# Patient Record
Sex: Male | Born: 1970 | Race: White | Hispanic: No | Marital: Married | Smoking: Former smoker
Health system: Southern US, Community
[De-identification: ages and names within clinical notes are randomized; demographics above are authoritative.]

## PROBLEM LIST (undated history)

## (undated) ENCOUNTER — Emergency Department (HOSPITAL_COMMUNITY): Admission: EM | Payer: Self-pay | Source: Home / Self Care

## (undated) DIAGNOSIS — I10 Essential (primary) hypertension: Secondary | ICD-10-CM

## (undated) DIAGNOSIS — R42 Dizziness and giddiness: Secondary | ICD-10-CM

## (undated) DIAGNOSIS — M109 Gout, unspecified: Secondary | ICD-10-CM

## (undated) DIAGNOSIS — J45909 Unspecified asthma, uncomplicated: Secondary | ICD-10-CM

## (undated) DIAGNOSIS — T7840XA Allergy, unspecified, initial encounter: Secondary | ICD-10-CM

## (undated) HISTORY — PX: CHOLECYSTECTOMY: SHX55

## (undated) HISTORY — DX: Unspecified asthma, uncomplicated: J45.909

## (undated) HISTORY — PX: VASECTOMY REVERSAL: SHX243

## (undated) HISTORY — DX: Gout, unspecified: M10.9

## (undated) HISTORY — DX: Dizziness and giddiness: R42

## (undated) HISTORY — DX: Allergy, unspecified, initial encounter: T78.40XA

## (undated) HISTORY — PX: VASECTOMY: SHX75

## (undated) HISTORY — DX: Essential (primary) hypertension: I10

## (undated) HISTORY — PX: ESOPHAGEAL DILATION: SHX303

---

## 2011-01-08 ENCOUNTER — Emergency Department (HOSPITAL_COMMUNITY)
Admission: EM | Admit: 2011-01-08 | Discharge: 2011-01-08 | Discharge status: Against Medical Advice | Payer: BLUE CROSS/BLUE SHIELD | Attending: Emergency Medicine | Admitting: Emergency Medicine

## 2011-01-08 DIAGNOSIS — Z79899 Other long term (current) drug therapy: Secondary | ICD-10-CM | POA: Insufficient documentation

## 2011-01-08 DIAGNOSIS — R12 Heartburn: Secondary | ICD-10-CM | POA: Insufficient documentation

## 2011-01-08 DIAGNOSIS — Z862 Personal history of diseases of the blood and blood-forming organs and certain disorders involving the immune mechanism: Secondary | ICD-10-CM | POA: Insufficient documentation

## 2011-01-08 DIAGNOSIS — R1011 Right upper quadrant pain: Secondary | ICD-10-CM | POA: Insufficient documentation

## 2011-01-08 DIAGNOSIS — Z8639 Personal history of other endocrine, nutritional and metabolic disease: Secondary | ICD-10-CM | POA: Insufficient documentation

## 2011-01-08 DIAGNOSIS — M549 Dorsalgia, unspecified: Secondary | ICD-10-CM | POA: Insufficient documentation

## 2011-01-11 ENCOUNTER — Encounter: Payer: Self-pay | Admitting: Family Medicine

## 2011-01-11 ENCOUNTER — Ambulatory Visit (INDEPENDENT_AMBULATORY_CARE_PROVIDER_SITE_OTHER): Payer: BLUE CROSS/BLUE SHIELD | Admitting: Family Medicine

## 2011-01-11 VITALS — BP 130/100 | Temp 98.2°F | Ht 64.5 in | Wt 188.0 lb

## 2011-01-11 DIAGNOSIS — R1011 Right upper quadrant pain: Secondary | ICD-10-CM

## 2011-01-11 LAB — BASIC METABOLIC PANEL
CO2: 29 mEq/L (ref 19–32)
Chloride: 103 mEq/L (ref 96–112)
Potassium: 4.5 mEq/L (ref 3.5–5.1)
Sodium: 140 mEq/L (ref 135–145)

## 2011-01-11 LAB — CBC WITH DIFFERENTIAL/PLATELET
Basophils Relative: 0.5 % (ref 0.0–3.0)
Eosinophils Absolute: 0.5 10*3/uL (ref 0.0–0.7)
HCT: 49.2 % (ref 39.0–52.0)
Hemoglobin: 17.1 g/dL — ABNORMAL HIGH (ref 13.0–17.0)
MCHC: 34.8 g/dL (ref 30.0–36.0)
MCV: 95 fl (ref 78.0–100.0)
Monocytes Absolute: 0.5 10*3/uL (ref 0.1–1.0)
Neutro Abs: 3.1 10*3/uL (ref 1.4–7.7)
RBC: 5.18 Mil/uL (ref 4.22–5.81)

## 2011-01-11 LAB — AMYLASE: Amylase: 56 U/L (ref 27–131)

## 2011-01-11 LAB — HEMOGLOBIN A1C: Hgb A1c MFr Bld: 5.2 % (ref 4.6–6.5)

## 2011-01-11 LAB — HEPATIC FUNCTION PANEL
ALT: 125 U/L — ABNORMAL HIGH (ref 0–53)
Albumin: 4.4 g/dL (ref 3.5–5.2)
Total Protein: 7.4 g/dL (ref 6.0–8.3)

## 2011-01-11 NOTE — Progress Notes (Signed)
  Subjective:    Patient ID: Jacob Fletcher, male    DOB: 02-12-1971, 40 y.o.   MRN: [id number]  HPIRichard is a 40 year old, divorced male, who comes in today for a violation of abdominal pain.  He states that 4 days ago.  He ate some fast food and about two to 3 hours later, he developed severe right upper quadrant abdominal pain.  It radiated up into his scapula.  After an hour.  The pain was a 7 to 8 and would not abate therefore, he called a friend, who took him to the emergency room.  Once he got to the emergency room.  The pain began to abate and by the time he was seen.  His pain was almost gone.  Date recommended an EKG and other studies.  However, he felt since his pain was gone.  He was in a go home, which he did.  He had a history of esophageal reflux with esophageal stricture that was dilated in 2009.  He supposed to be on Prilosec 20 daily forever, but is not taking his Prilosec.  He states he is altered his diet and has no symptoms of reflux anymore.  Family history is pertinent in his father had his gallbladder removed.    Review of Systems    General and GI review of systems other than above negative Objective:   Physical Exam Will help, well-nourished, male in no acute distress.  Examination the abdomen shows the abdomen is soft.  The bowel sounds are normal.  There is some tenderness to palpation.  Right upper quadrant.  I can appreciate no masses       Assessment & Plan:  Right upper quadrant, p.c., abdominal pain, probable gallbladder disease,,,,,,,,,, complete fat free diet,,,,,,,, labs today,,,,,,,,,,, set up ultrasound,,,,,,,,,,,,,,,,,, follow-up after ultrasound

## 2011-01-11 NOTE — Patient Instructions (Signed)
Complete fat free diet.  Labs today.  We will set you up for an ultrasound ASAP,,,,,,,,,, set up a follow-up appointment after the ultrasound to review the data with me.  If in the meantime, he does develop severe pain despite being on a fat-free diet come directly and immediately to the emergency room

## 2011-01-12 ENCOUNTER — Other Ambulatory Visit: Payer: Self-pay | Admitting: Family Medicine

## 2011-01-12 DIAGNOSIS — R1011 Right upper quadrant pain: Secondary | ICD-10-CM

## 2011-01-13 ENCOUNTER — Ambulatory Visit: Payer: BLUE CROSS/BLUE SHIELD | Admitting: Family Medicine

## 2011-01-13 ENCOUNTER — Ambulatory Visit
Admission: RE | Admit: 2011-01-13 | Discharge: 2011-01-13 | Disposition: A | Discharge status: Home / Self Care | Payer: BLUE CROSS/BLUE SHIELD | Source: Ambulatory Visit | Attending: Family Medicine | Admitting: Family Medicine

## 2011-01-13 DIAGNOSIS — R1011 Right upper quadrant pain: Secondary | ICD-10-CM

## 2011-01-14 ENCOUNTER — Telehealth: Payer: Self-pay | Admitting: *Deleted

## 2011-01-14 NOTE — Telephone Encounter (Signed)
patient  Has an appointment with the surgeon 01/31/11 and would like to know if it is okay to wait until then?

## 2011-01-17 NOTE — Telephone Encounter (Signed)
I think that's okay about if he feels like he needs to be seen sooner.  Tell him to call the general surgeon's office and to be seen sooner

## 2011-01-18 NOTE — Telephone Encounter (Signed)
Left message on machine for patient

## 2011-02-08 ENCOUNTER — Other Ambulatory Visit: Payer: Self-pay | Admitting: General Surgery

## 2011-02-08 ENCOUNTER — Encounter (HOSPITAL_COMMUNITY): Payer: BC Managed Care – PPO | Attending: General Surgery

## 2011-02-08 DIAGNOSIS — Z01812 Encounter for preprocedural laboratory examination: Secondary | ICD-10-CM | POA: Insufficient documentation

## 2011-02-08 DIAGNOSIS — R1011 Right upper quadrant pain: Secondary | ICD-10-CM | POA: Insufficient documentation

## 2011-02-08 DIAGNOSIS — K8020 Calculus of gallbladder without cholecystitis without obstruction: Secondary | ICD-10-CM | POA: Insufficient documentation

## 2011-02-08 LAB — COMPREHENSIVE METABOLIC PANEL
Alkaline Phosphatase: 81 U/L (ref 39–117)
BUN: 12 mg/dL (ref 6–23)
Chloride: 102 mEq/L (ref 96–112)
Creatinine, Ser: 0.95 mg/dL (ref 0.4–1.5)
Glucose, Bld: 71 mg/dL (ref 70–99)
Potassium: 3.9 mEq/L (ref 3.5–5.1)
Total Bilirubin: 0.5 mg/dL (ref 0.3–1.2)

## 2011-02-08 LAB — CBC
HCT: 47.3 % (ref 39.0–52.0)
MCH: 31.5 pg (ref 26.0–34.0)
MCV: 90.4 fL (ref 78.0–100.0)
RBC: 5.23 MIL/uL (ref 4.22–5.81)
RDW: 12.3 % (ref 11.5–15.5)
WBC: 6.1 10*3/uL (ref 4.0–10.5)

## 2011-02-08 LAB — DIFFERENTIAL
Basophils Relative: 1 % (ref 0–1)
Eosinophils Absolute: 0.4 10*3/uL (ref 0.0–0.7)
Eosinophils Relative: 6 % — ABNORMAL HIGH (ref 0–5)
Monocytes Relative: 7 % (ref 3–12)
Neutrophils Relative %: 44 % (ref 43–77)

## 2011-02-08 LAB — SURGICAL PCR SCREEN
MRSA, PCR: NEGATIVE
Staphylococcus aureus: POSITIVE — AB

## 2011-02-16 ENCOUNTER — Other Ambulatory Visit (INDEPENDENT_AMBULATORY_CARE_PROVIDER_SITE_OTHER): Payer: Self-pay | Admitting: General Surgery

## 2011-02-16 ENCOUNTER — Ambulatory Visit (HOSPITAL_COMMUNITY)
Admission: RE | Admit: 2011-02-16 | Discharge: 2011-02-16 | Disposition: A | Discharge status: Home / Self Care | Payer: BC Managed Care – PPO | Source: Ambulatory Visit | Attending: General Surgery | Admitting: General Surgery

## 2011-02-16 ENCOUNTER — Ambulatory Visit (HOSPITAL_COMMUNITY): Payer: BC Managed Care – PPO

## 2011-02-16 DIAGNOSIS — J45909 Unspecified asthma, uncomplicated: Secondary | ICD-10-CM | POA: Insufficient documentation

## 2011-02-16 DIAGNOSIS — R1011 Right upper quadrant pain: Secondary | ICD-10-CM | POA: Insufficient documentation

## 2011-02-16 DIAGNOSIS — K8010 Calculus of gallbladder with chronic cholecystitis without obstruction: Secondary | ICD-10-CM | POA: Insufficient documentation

## 2011-02-17 NOTE — Op Note (Signed)
NAME:  Jacob Fletcher, Jacob Fletcher NO.:  [id number]  MEDICAL RECORD NO.:  [id number]           PATIENT TYPE:  O  LOCATION:  DAYL                         FACILITY:  [facility]  PHYSICIAN:  Juanetta Gosling, MDDATE OF BIRTH:  02/24/1971  DATE OF PROCEDURE:  02/16/2011 DATE OF DISCHARGE:                              OPERATIVE REPORT   PREOPERATIVE DIAGNOSIS:  Symptomatic cholelithiasis.  POSTOPERATIVE DIAGNOSIS:  Symptomatic cholelithiasis.  PROCEDURE:  Laparoscopic cholecystectomy with intraoperative cholangiogram.  SURGEON:  Juanetta Gosling, MD.  ASSISTANT:  Anselm Pancoast. Zachery Dakins, M.D.  ANESTHESIA:  General.  SPECIMENS:  Gallbladder and contents to Pathology.  ESTIMATED BLOOD LOSS:  Minimal.  COMPLICATIONS:  None.  DRAINS:  None.  DISPOSITION:  To recovery room in stable condition.  INDICATIONS:  This is a 40 year old male who has had some right upper quadrant pain described as under his ribs as well as his right scapula. This has been worsening and he continues to have some right upper quadrant soreness on a daily basis.  He was evaluated by Dr. Tawanna Cooler and was found to have mildly elevated AST, ALT with normal other liver function tests.  An ultrasound showed a solitary gallstone measuring about 1 cm with no gallbladder wall thickening or pericholecystic fluid and negative sonographic Murphy sign.  He was then referred for evaluation for cholecystectomy.  He did appear to have symptomatic cholelithiasis and he and I discussed the laparoscopic cholecystectomy with the cholangiogram given his abnormal LFTs.  DESCRIPTION OF PROCEDURE:  After informed consent was obtained, the patient was taken to the operating room where he was administered 1 g of intravenous cefoxitin.  Sequential compression devices were placed on lower extremities prior to induction with anesthesia.  He was then placed under general anesthesia without complications.  His abdomen was prepped  and draped in standard sterile surgical fashion.  Surgical time- out was then performed.  I infiltrated 0.25% Marcaine below his umbilicus.  I then made a vertical incision, carried this down to his fascia.  His umbilical stalk was grasped with Kocher clamp.  I incised this fascia with a #11 blade. The peritoneum was then entered bluntly with a Kelly clamp.  A 0 Vicryl pursestring suture was then placed through the fascia.  Hassan trocar was introduced and the abdomen was insufflated to 15 mmHg.  He was then placed in reverse Trendelenburg position.  Three further 5-mm trocars were placed in the epigastrium and right upper quadrant under direct vision without complication after infiltration with local anesthetic. Gallbladder was noted to have some adhesions to his duodenum and his omentum.  These were taken down bluntly.  His gallbladder was then retracted cephalad and lateral.  He had a fair amount of scarring at his triangle of Calot.  Eventually, we were able to obtain a critical view of safety.  I clipped his artery and divided this.  Due to his abnormal LFTs, I did clip his duct distally, made a ductotomy and got colon bile out of this.  I placed a Cook catheter in this and then performed a cholangiogram.  It appeared that he had a very long cystic  duct that went into an area, there was a fair amount of scarring present.  The contrast flowed into his duodenum into his common duct.  It did not flow into his liver.  We tried multiple maneuvers to attempt to do this but I think that due to the low insertion of his cystic duct, we were not able to get it to fill his liver. There were also either some very small stones or air bubbles note.  I milked the cystic duct again and there were no stones and contrast did easily flow into duodenum.  Both Dr. Zachery Dakins and I felt very comfortable due to our anatomy, the way it looked, and I took a picture of this that we were at the cystic duct at  this point and we decided as oppose to doing further dissection at this point both of [place] thought it would be safer just to clip the duct and divide it which we did.  The gallbladder was removed from the liver bed.  I made a small hole in the gallbladder due to the fact that it was very adherent and did spill some bile but this was difficult to remove but otherwise this was uneventful. The gallbladder was removed from the liver bed, placed in EndoCatch bag and removed from the umbilicus.  I obtained hemostasis on the gallbladder bed.  Irrigation was performed until this was clear.  I then removed the [facility] trocar.  I closed this down and viewed this from the epigastric site.  There were no entry injury.  There was no further defect upon this.  I then desufflated the abdomen and removed all trocars.  All incisions were closed with 4-0 Monocryl and Dermabond were placed over these.  He tolerated this well, was extubated, and transferred to recovery room in stable condition.     Juanetta Gosling, MD     MCW/MEDQ  D:  02/16/2011  T:  02/16/2011  Job:  [id number]  cc:   Tinnie Gens A. Tawanna Cooler, MD [address] [place] [address]  Electronically Signed by Emelia Loron MD on 02/17/2011 10:51:51 AM

## 2011-05-09 ENCOUNTER — Ambulatory Visit (INDEPENDENT_AMBULATORY_CARE_PROVIDER_SITE_OTHER): Payer: BC Managed Care – PPO | Admitting: Family Medicine

## 2011-05-09 ENCOUNTER — Encounter: Payer: Self-pay | Admitting: Family Medicine

## 2011-05-09 DIAGNOSIS — N529 Male erectile dysfunction, unspecified: Secondary | ICD-10-CM

## 2011-05-09 DIAGNOSIS — K829 Disease of gallbladder, unspecified: Secondary | ICD-10-CM

## 2011-05-09 MED ORDER — SILDENAFIL CITRATE 50 MG PO TABS: 50.0000 mg | ORAL_TABLET | Freq: Every day | ORAL | Status: AC | PRN

## 2011-05-09 NOTE — Progress Notes (Signed)
  Subjective:    Patient ID: Jacob Fletcher, male    DOB: [birth year], 40 y.o.   MRN: [id number]  HPI Jacob Fletcher is a 40 year old divorced male, nonsmoker, who comes in today following a gallbladder removal.  We saw him in the spring with symptoms of acute cholecystitis.  He subsequently had his gallbladder removed by Dr. Dwain Sarna, and did well.  No complications.  He has a new girlfriend.  When he had his secondly for half years ago.  There was complications with bleeding and after that.  He had difficulty maintaining erection.  We discussed for his options will try Viagra first   Review of Systems    General and neurologic review of systems otherwise negative Objective:   Physical Exam  Well-developed well-nourished, male in no acute distress      Assessment & Plan:  Status post gallbladder removal, asymptomatic.  History of sexual difficulty following a complicated, v acetectomy, and Viagra p.r.n.

## 2011-05-09 NOTE — Patient Instructions (Signed)
Take one half tablet of the Viagra at two hours prior to sex with water.  If you have any further questions then I would recommend a urology consult with Dr. Bjorn Pippin

## 2011-05-10 LAB — LDL CHOLESTEROL, DIRECT: Direct LDL: 130.6 mg/dL

## 2011-06-07 ENCOUNTER — Ambulatory Visit (INDEPENDENT_AMBULATORY_CARE_PROVIDER_SITE_OTHER): Payer: BC Managed Care – PPO | Admitting: Family Medicine

## 2011-06-07 ENCOUNTER — Encounter: Payer: Self-pay | Admitting: Family Medicine

## 2011-06-07 VITALS — BP 112/66 | HR 97 | Temp 99.1°F | Wt 178.0 lb

## 2011-06-07 DIAGNOSIS — B084 Enteroviral vesicular stomatitis with exanthem: Secondary | ICD-10-CM

## 2011-06-07 MED ORDER — METHYLPREDNISOLONE ACETATE 80 MG/ML IJ SUSP
120.0000 mg | Freq: Once | INTRAMUSCULAR | Status: AC
  Administered 2011-06-07: 120 mg via INTRAMUSCULAR

## 2011-06-07 NOTE — Progress Notes (Signed)
  Subjective:    Patient ID: Jacob Fletcher, male    DOB: 09/16/1971, 40 y.o.   MRN: [id number]  HPI Here for 4 days of fevers as high as 102.9 degrees, body aches, and a severe ST. He has had blister like lesions in his mouth, but no rashes anywhere else on his body. No HA or cough or NVD. He has a hx of GERD, and he has had bad heartburn lately. Drinking fluids and taking Tylenol.    Review of Systems  Constitutional: Positive for fever.  HENT: Positive for sore throat and mouth sores. Negative for congestion, neck pain, neck stiffness, postnasal drip and sinus pressure.   Respiratory: Negative.        Objective:   Physical Exam  Constitutional: He appears well-developed and well-nourished.  HENT:  Right Ear: External ear normal.  Left Ear: External ear normal.  Nose: Nose normal.  Mouth/Throat: No oropharyngeal exudate.       Scattered vessicles in the posterior OP and under the tongue   Neck: Normal range of motion. Neck supple. No thyromegaly present.       Mild tender AC adenopathy   Pulmonary/Chest: Effort normal and breath sounds normal.  Skin: Skin is warm and dry. No rash noted. No erythema.          Assessment & Plan:  This is a viral syndrome, probably hand, foot, and mouth disease. Use Tylenol for fever. Given a Depomedrol shot for mouth discomfort. Try Prilosec OTC and Mylanta prn .

## 2011-06-07 NOTE — Progress Notes (Signed)
  Subjective:    Patient ID: Jacob Fletcher, male    DOB: 10/18/1970, 40 y.o.   MRN: [id number]  HPI This is a duplicate entry    Review of Systems     Objective:   Physical Exam        Assessment & Plan:

## 2011-12-07 ENCOUNTER — Telehealth: Payer: Self-pay | Admitting: Family Medicine

## 2011-12-07 DIAGNOSIS — Z9852 Vasectomy status: Secondary | ICD-10-CM

## 2011-12-07 NOTE — Telephone Encounter (Signed)
Pt calling in about a possible vasectomy reversal. States that he's talked to Dr. Tawanna Cooler about this before. He is requesting a referral to a urologist. Please advise.

## 2011-12-08 NOTE — Telephone Encounter (Signed)
Patient can call the urology group and make his own appointment.........Marland Kitchen

## 2011-12-08 NOTE — Telephone Encounter (Signed)
Referral request sent 

## 2012-01-11 ENCOUNTER — Telehealth: Payer: Self-pay | Admitting: Family Medicine

## 2012-01-11 DIAGNOSIS — N509 Disorder of male genital organs, unspecified: Secondary | ICD-10-CM

## 2012-01-11 NOTE — Telephone Encounter (Signed)
Patient called stating that he called about a referral and was never called back by our office and one day he received a call from [facility] and they asked him who he wanted to see and then was put with a MD who no longer did vasectomy reversals and the MD refused to even speak to him and they cancelled the appt. Patient is requesting to be referred to another urology office and would like to receive updates on what's being done so that there will not be any further mix up. Please assist/advise and inform patient of advice.

## 2012-03-21 ENCOUNTER — Encounter: Payer: Self-pay | Admitting: Family Medicine

## 2012-03-21 ENCOUNTER — Ambulatory Visit (INDEPENDENT_AMBULATORY_CARE_PROVIDER_SITE_OTHER): Payer: BC Managed Care – PPO | Admitting: Family Medicine

## 2012-03-21 VITALS — BP 150/100 | HR 103 | Temp 98.7°F | Wt 184.0 lb

## 2012-03-21 DIAGNOSIS — M722 Plantar fascial fibromatosis: Secondary | ICD-10-CM

## 2012-03-21 DIAGNOSIS — B351 Tinea unguium: Secondary | ICD-10-CM

## 2012-03-21 MED ORDER — ETODOLAC 500 MG PO TABS: 500.0000 mg | ORAL_TABLET | Freq: Two times a day (BID) | ORAL | Status: AC

## 2012-03-21 MED ORDER — TERBINAFINE HCL 250 MG PO TABS: 250.0000 mg | ORAL_TABLET | Freq: Every day | ORAL | Status: AC

## 2012-03-21 NOTE — Progress Notes (Signed)
  Subjective:    Patient ID: Jacob Fletcher, male    DOB: 28-Nov-1970, 41 y.o.   MRN: [id number]  HPI Here for 2 things. First for 2 weeks he has had pain on the bottom of his left heel which is worse when he stands after sitting for a time. No hx of trauma. Advil helps. He wears sandals at work and goes barefoot on [employer] at home. Also for one year he has had discoloration on the toenails of the left foot.   Review of Systems  Constitutional: Negative.   Musculoskeletal: Positive for arthralgias.       Objective:   Physical Exam  Constitutional:       Limping a bit   Musculoskeletal:       Tender over the bottom and sides of the left heel. The arch is intact.  Skin:       fungal involvement of the 4th and 5th toenails on the left           Assessment & Plan:  Wear athletic supportive shoes at home, use gel arch and heel inserts inside his shoes at work. Ice packs prn and etodolac. Use terbinafine for the toenails

## 2012-09-07 ENCOUNTER — Encounter: Payer: Self-pay | Admitting: Family Medicine

## 2012-09-07 ENCOUNTER — Ambulatory Visit (INDEPENDENT_AMBULATORY_CARE_PROVIDER_SITE_OTHER): Payer: BC Managed Care – PPO | Admitting: Family Medicine

## 2012-09-07 VITALS — BP 144/100 | HR 97 | Temp 98.4°F | Wt 184.0 lb

## 2012-09-07 DIAGNOSIS — J45909 Unspecified asthma, uncomplicated: Secondary | ICD-10-CM | POA: Insufficient documentation

## 2012-09-07 DIAGNOSIS — Z9109 Other allergy status, other than to drugs and biological substances: Secondary | ICD-10-CM | POA: Insufficient documentation

## 2012-09-07 MED ORDER — LORATADINE-PSEUDOEPHEDRINE ER 10-240 MG PO TB24: 1.0000 | ORAL_TABLET | Freq: Every day | ORAL | Status: DC

## 2012-09-07 MED ORDER — BECLOMETHASONE DIPROPIONATE 40 MCG/ACT IN AERS: 2.0000 | INHALATION_SPRAY | Freq: Two times a day (BID) | RESPIRATORY_TRACT | Status: DC

## 2012-09-07 MED ORDER — ALBUTEROL SULFATE HFA 108 (90 BASE) MCG/ACT IN AERS: 2.0000 | INHALATION_SPRAY | RESPIRATORY_TRACT | Status: DC | PRN

## 2012-09-07 NOTE — Progress Notes (Signed)
  Subjective:    Patient ID: Jacob Fletcher, male    DOB: [birth year], 41 y.o.   MRN: [id number]  HPI Here for asthma. He had asthma as a child and took allergy shots until he was a teenager. Then things settled down and he was not bothered until lately. He takes Claritin D daily for stuffy nose and head. He was recently engaged and moved in a house with his fiance who has a dog. Now he has been experiencing daily coughing, wheezing, and SOB when he is around the dog. When he goes to work he feels fine all day.   Review of Systems  Constitutional: Negative.   HENT: Negative.   Eyes: Negative.   Respiratory: Positive for cough, chest tightness, shortness of breath and wheezing.   Cardiovascular: Negative.        Objective:   Physical Exam  Constitutional: He appears well-developed and well-nourished. No distress.  Neck: No thyromegaly present.  Cardiovascular: Normal rate, regular rhythm, normal heart sounds and intact distal pulses.   Pulmonary/Chest: Effort normal and breath sounds normal. No respiratory distress. He has no wheezes. He has no rales.  Lymphadenopathy:    He has no cervical adenopathy.          Assessment & Plan:  He has developed some asthma again, probably in response to living in a house with a dog. Of course they will not get rid of the dog, so we will start on inhaler therapy. Use Qvar bid for maintenance and Proair for rescue. Recheck prn

## 2012-09-09 IMAGING — US US ABDOMEN COMPLETE
1 series · 13 of 25 positions shown · non-contrast
Comparison: None.

CLINICAL DATA: Right upper quadrant abdominal pain.

COMPLETE ABDOMINAL ULTRASOUND 01/13/2011:

[Series 1: us abdomen complete · 0.31mm/px · 13 of 82 slices shown]
[im 1/82]
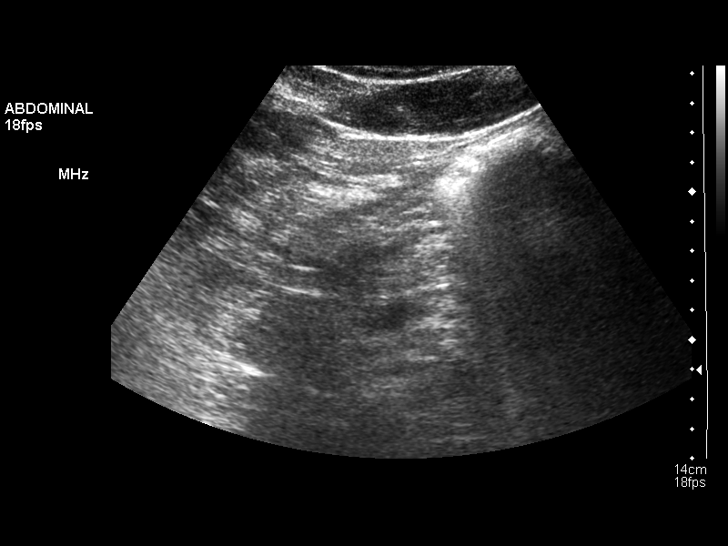
[im 7/82]
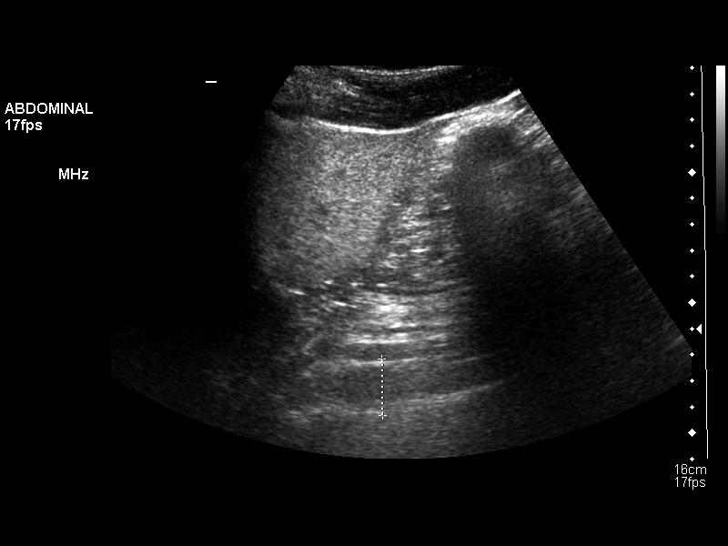
[im 14/82]
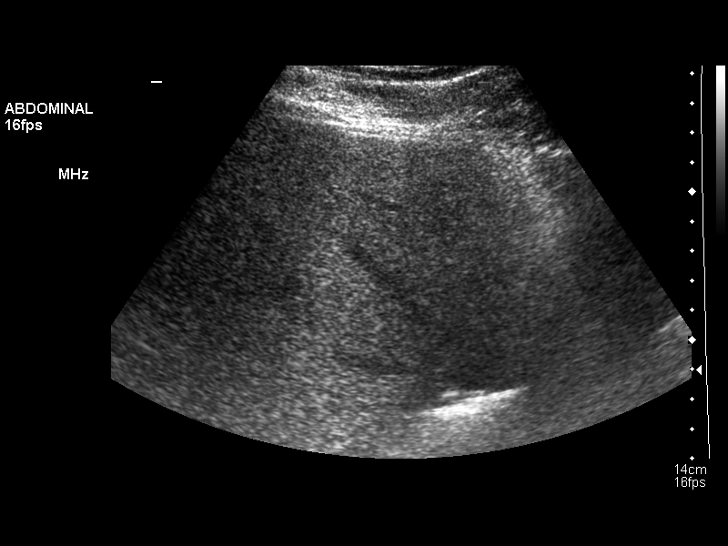
[im 21/82]
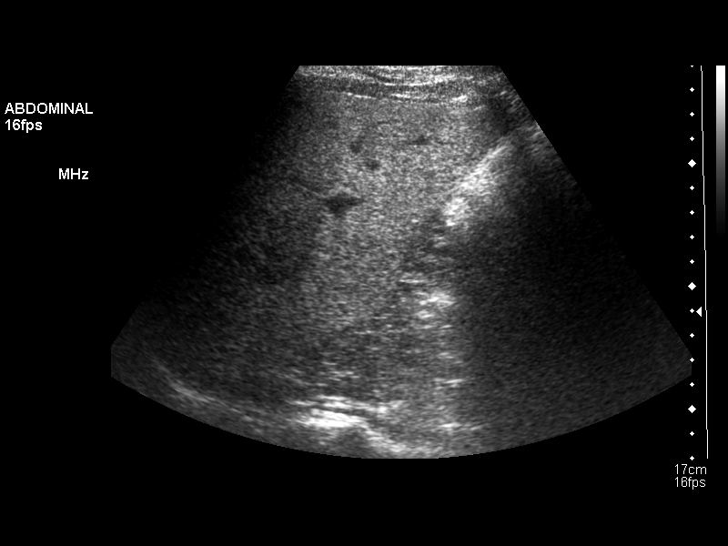
[im 28/82]
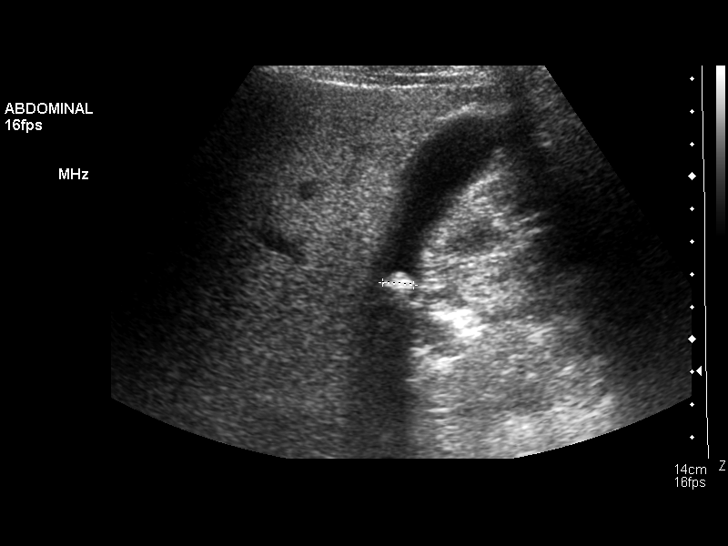
[im 34/82]
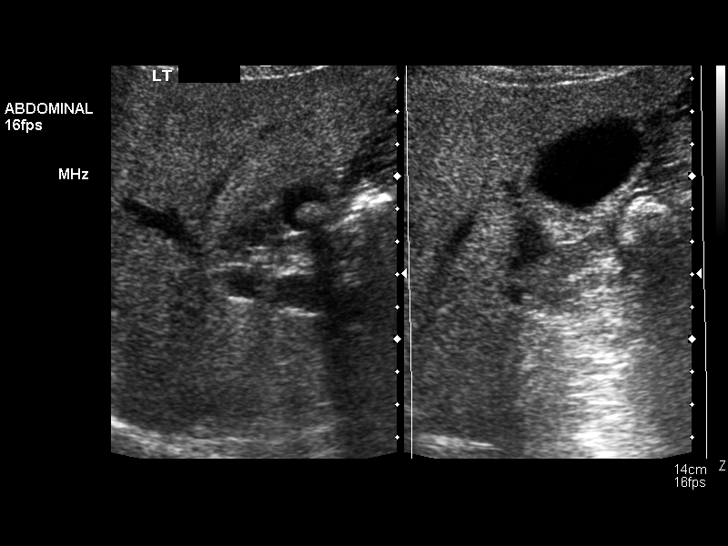
[im 41/82]
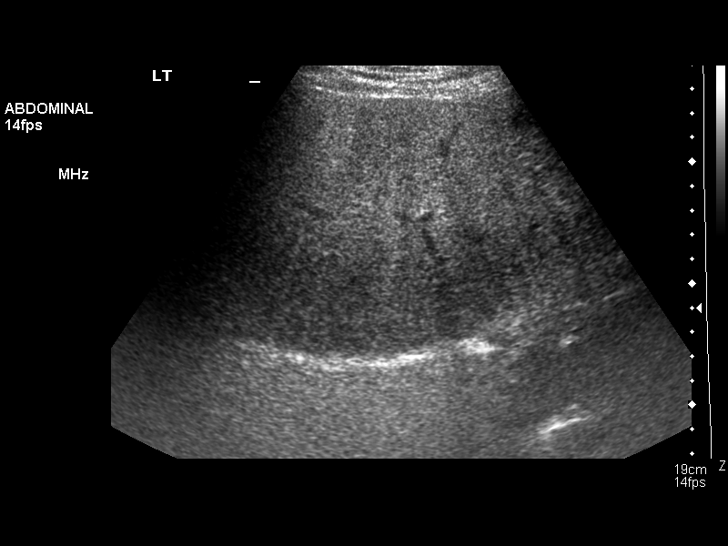
[im 48/82]
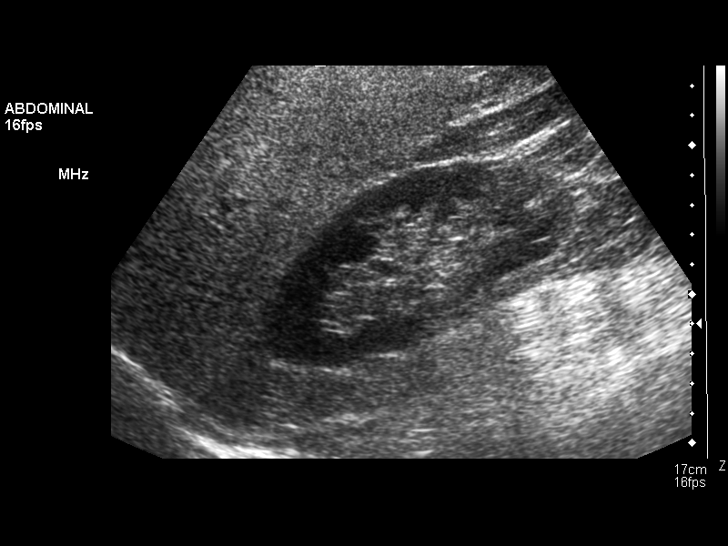
[im 55/82]
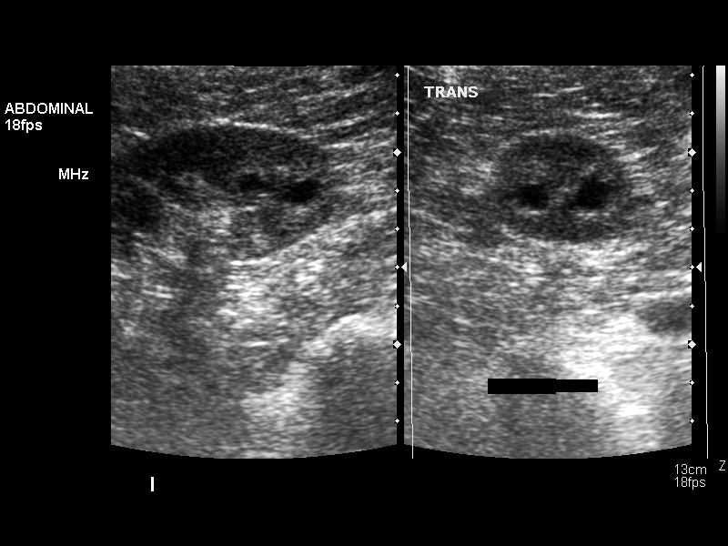
[im 61/82]
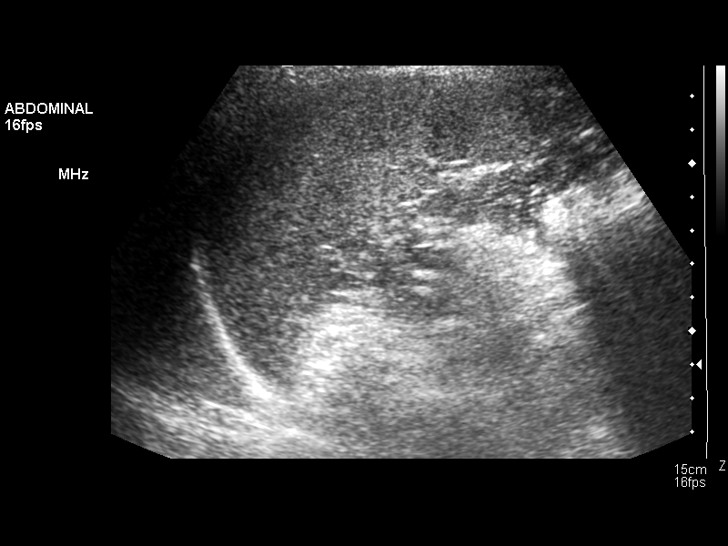
[im 68/82]
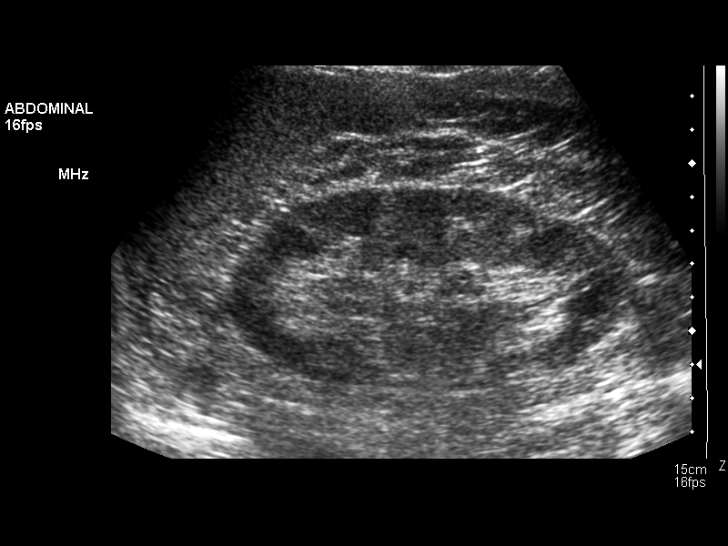
[im 75/82]
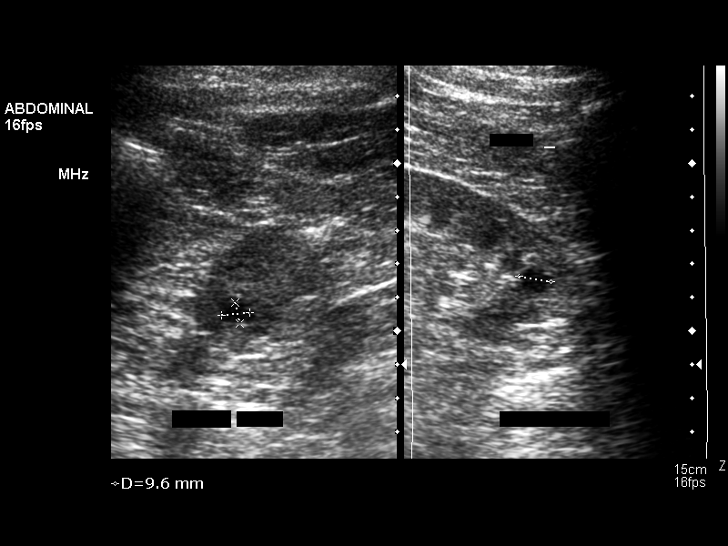
[im 82/82]
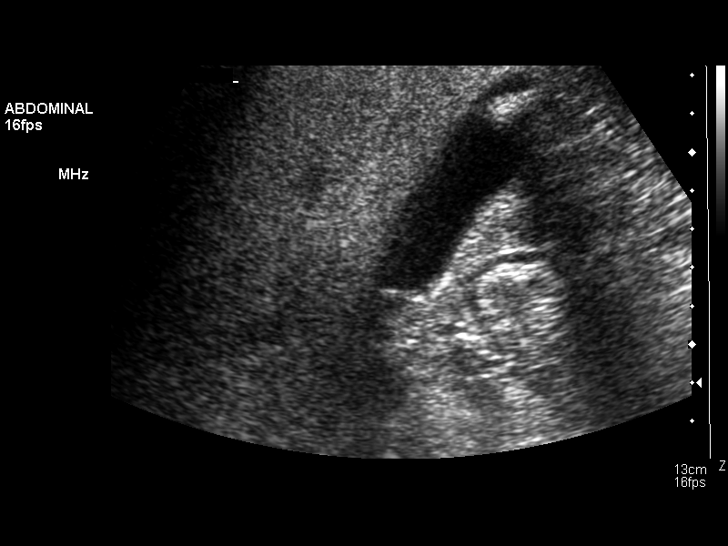

[13 of 25 positions shown; findings below may reference images not displayed]

FINDINGS: Gallbladder:  Solitary mobile shadowing gallstone measuring
approximately 1 cm.  No gallbladder wall thickening or
pericholecystic fluid.  Negative sonographic Murphy's sign
according to the ultrasound technologist.

Common bile duct:  Normal in caliber with maximum diameter
approximating 3 mm.  No visible bile duct stones.

Liver:  Diffusely increased and coarsened echotexture without focal
hepatic parenchymal abnormality.  Patent portal vein with
hepatopetal flow.

IVC:  Patent.

Pancreas:  Although the pancreas is difficult to visualize in its
entirety, no focal pancreatic abnormality is identified.  The head
was obscured by duodenal bowel gas.

Spleen:  Normal size and echotexture without focal parenchymal
abnormality.

Right Kidney:  No hydronephrosis.  Well-preserved cortex.  Normal
parenchymal echotexture.  2 adjacent simple cysts arising from the
lower pole, each on the order of 1.0-1.2 cm.  No significant focal
parenchymal abnormality.  Approximately 11.4 cm length.

Left Kidney:  No hydronephrosis.  Well-preserved cortex.  Normal
parenchymal echotexture.  Approximate 1.0 cm simple cyst arising
from the lower pole.  No significant focal parenchymal abnormality.
Approximately 12.3 cm length.

Abdominal aorta:  Mildly atherosclerotic but normal in caliber
throughout its visualized course in the abdomen.
IMPRESSION: 1.  Solitary gallstone within the gallbladder.  No sonographic
evidence of acute cholecystitis.
2.  Diffuse hepatic steatosis without focal hepatic parenchymal
abnormality.
3.  Simple cysts arising from the lower poles of both kidneys.

## 2013-07-25 ENCOUNTER — Telehealth: Payer: Self-pay | Admitting: Family Medicine

## 2013-07-25 MED ORDER — LORATADINE-PSEUDOEPHEDRINE ER 10-240 MG PO TB24: 1.0000 | ORAL_TABLET | Freq: Every day | ORAL | Status: DC

## 2013-07-25 NOTE — Telephone Encounter (Signed)
Pt request refill of loratadine-pseudoephedrine (CLARITIN-D 24-HOUR) 10-240 MG per 24 hr tablet 1/day [facility]/[address]/[place]

## 2013-08-01 ENCOUNTER — Encounter: Payer: Self-pay | Admitting: Family Medicine

## 2013-08-01 ENCOUNTER — Ambulatory Visit (INDEPENDENT_AMBULATORY_CARE_PROVIDER_SITE_OTHER): Payer: BC Managed Care – PPO | Admitting: Family Medicine

## 2013-08-01 ENCOUNTER — Telehealth: Payer: Self-pay | Admitting: Family Medicine

## 2013-08-01 VITALS — BP 148/110 | Temp 98.1°F | Wt 178.0 lb

## 2013-08-01 DIAGNOSIS — J45909 Unspecified asthma, uncomplicated: Secondary | ICD-10-CM

## 2013-08-01 DIAGNOSIS — Z9109 Other allergy status, other than to drugs and biological substances: Secondary | ICD-10-CM

## 2013-08-01 DIAGNOSIS — Z0000 Encounter for general adult medical examination without abnormal findings: Secondary | ICD-10-CM

## 2013-08-01 DIAGNOSIS — R040 Epistaxis: Secondary | ICD-10-CM

## 2013-08-01 DIAGNOSIS — R030 Elevated blood-pressure reading, without diagnosis of hypertension: Secondary | ICD-10-CM

## 2013-08-01 MED ORDER — FLUTICASONE PROPIONATE 50 MCG/ACT NA SUSP: NASAL | Status: DC

## 2013-08-01 NOTE — Progress Notes (Signed)
  Subjective:    Patient ID: Jacob Fletcher, male    DOB: [birth year], 42 y.o.   MRN: [id number]  HPI Jacob Fletcher is a 42 year old recently married,,,,,,,,,,,,, August 30 of this year,,,,,, nonsmoker who comes in today for evaluation of 3 concerns  For the past month he's been having left-sided nosebleeds. No history of trauma.  He recently got married in August is that as above. His new wife has a dog which is now their dog. He has a history of a lot of allergy problems.  In the past she's had a vasectomy and would like that reversed  He has a medical recently diagnosed with coronary disease and would like to discuss his risk factors for coronary disease  He has allergic rhinitis for which she takes Claritin-D. BP today 148/110. Repeat by me 140/90.  He uses albuterol when necessary and not using the inhaled steroid   Review of Systems Review of systems otherwise negative    Objective:   Physical Exam Well-developed well-nourished male no acute distress HEENT pertinent there is a septal deviation to left and marked 3+ out of 4 nasal edema neck was supple no adenopathy BP right arm sitting position 140/90 pulse 70 and regular       Assessment & Plan:  Left-sided nosebleeds with the history of underlying allergic rhinitis and on a pseudoephedrine product,,,,,,,,,,, stop the pseudoephedrine plain antihistamine one shot of steroid nasal spray up each nostril at bedtime  Question hypertension BP checked every morning at home x3 weeks return if pressure elevated  Dr. Laverle Patter to reverse the a vasectomy

## 2013-08-01 NOTE — Telephone Encounter (Signed)
Pt states dr todd to give him name of md that does vascetomy reversals

## 2013-08-01 NOTE — Telephone Encounter (Signed)
Dr. Crecencio Mc at the urology Center

## 2013-08-01 NOTE — Patient Instructions (Signed)
Take a plain antihistamine,,,,,,,,, no D.  Steroid nasal spray,,,,,,, one-shot each nostril at bedtime  Purchase a digital blood pressure cuff,,,,,,,,omron,,,,,,,, seems to be the most accurate device and check your blood pressure daily in the morning for 3 weeks.  Blood pressure goal 135/85 or less  If your blood pressure is not at goal return in 3 weeks with the data and the device for followup  Set up a time this winter for a physical examination  One week before your exam fasting labs

## 2013-08-02 NOTE — Telephone Encounter (Signed)
Left message on machine for patient to return our call 

## 2013-08-02 NOTE — Telephone Encounter (Signed)
Patient is aware 

## 2013-08-08 ENCOUNTER — Telehealth: Payer: Self-pay | Admitting: *Deleted

## 2013-08-08 NOTE — Telephone Encounter (Signed)
Patient is calling because Dr Laverle Patter does not do vasectomy reversal and would like Dr Tawanna Cooler to know.  Left message on machine returning patient's call

## 2013-08-09 NOTE — Telephone Encounter (Signed)
Noted by Dr Todd 

## 2013-10-14 ENCOUNTER — Other Ambulatory Visit: Payer: BC Managed Care – PPO

## 2013-10-21 ENCOUNTER — Encounter: Payer: BC Managed Care – PPO | Admitting: Family Medicine

## 2013-11-11 ENCOUNTER — Other Ambulatory Visit (INDEPENDENT_AMBULATORY_CARE_PROVIDER_SITE_OTHER): Payer: BC Managed Care – PPO

## 2013-11-11 DIAGNOSIS — Z0000 Encounter for general adult medical examination without abnormal findings: Secondary | ICD-10-CM

## 2013-11-11 LAB — CBC WITH DIFFERENTIAL/PLATELET
BASOS PCT: 0.5 % (ref 0.0–3.0)
Basophils Absolute: 0 10*3/uL (ref 0.0–0.1)
EOS ABS: 0.6 10*3/uL (ref 0.0–0.7)
EOS PCT: 8.4 % — AB (ref 0.0–5.0)
HCT: 50.6 % (ref 39.0–52.0)
HEMOGLOBIN: 17 g/dL (ref 13.0–17.0)
LYMPHS PCT: 38.7 % (ref 12.0–46.0)
Lymphs Abs: 2.6 10*3/uL (ref 0.7–4.0)
MCHC: 33.7 g/dL (ref 30.0–36.0)
MCV: 94.2 fl (ref 78.0–100.0)
MONOS PCT: 6.9 % (ref 3.0–12.0)
Monocytes Absolute: 0.5 10*3/uL (ref 0.1–1.0)
NEUTROS PCT: 45.5 % (ref 43.0–77.0)
Neutro Abs: 3.1 10*3/uL (ref 1.4–7.7)
Platelets: 251 10*3/uL (ref 150.0–400.0)
RBC: 5.37 Mil/uL (ref 4.22–5.81)
RDW: 12.9 % (ref 11.5–14.6)
WBC: 6.8 10*3/uL (ref 4.5–10.5)

## 2013-11-11 LAB — HEPATIC FUNCTION PANEL
ALT: 52 U/L (ref 0–53)
AST: 29 U/L (ref 0–37)
Albumin: 4.3 g/dL (ref 3.5–5.2)
Alkaline Phosphatase: 71 U/L (ref 39–117)
Bilirubin, Direct: 0 mg/dL (ref 0.0–0.3)
Total Bilirubin: 0.7 mg/dL (ref 0.3–1.2)
Total Protein: 7.3 g/dL (ref 6.0–8.3)

## 2013-11-11 LAB — POCT URINALYSIS DIPSTICK
Bilirubin, UA: NEGATIVE
Glucose, UA: NEGATIVE
Ketones, UA: NEGATIVE
Leukocytes, UA: NEGATIVE
Nitrite, UA: NEGATIVE
Protein, UA: NEGATIVE
RBC UA: NEGATIVE
Spec Grav, UA: 1.02
UROBILINOGEN UA: 0.2
pH, UA: 7

## 2013-11-11 LAB — BASIC METABOLIC PANEL
BUN: 11 mg/dL (ref 6–23)
CALCIUM: 9.1 mg/dL (ref 8.4–10.5)
CO2: 24 mEq/L (ref 19–32)
CREATININE: 1 mg/dL (ref 0.4–1.5)
Chloride: 104 mEq/L (ref 96–112)
GFR: 85.31 mL/min (ref >60.00)
Glucose, Bld: 87 mg/dL (ref 70–99)
Potassium: 3.9 mEq/L (ref 3.5–5.1)
SODIUM: 139 meq/L (ref 135–145)

## 2013-11-11 LAB — LIPID PANEL
Cholesterol: 175 mg/dL (ref 0–200)
HDL: 37 mg/dL — AB (ref >39.00)
TRIGLYCERIDES: 216 mg/dL — AB (ref 0.0–149.0)
Total CHOL/HDL Ratio: 5
VLDL: 43.2 mg/dL — AB (ref 0.0–40.0)

## 2013-11-11 LAB — LDL CHOLESTEROL, DIRECT: LDL DIRECT: 115.5 mg/dL

## 2013-11-11 LAB — TSH: TSH: 0.63 u[IU]/mL (ref 0.35–5.50)

## 2013-11-18 ENCOUNTER — Encounter: Payer: BC Managed Care – PPO | Admitting: Family Medicine

## 2013-12-02 ENCOUNTER — Encounter: Payer: Self-pay | Admitting: Family Medicine

## 2013-12-02 ENCOUNTER — Ambulatory Visit (INDEPENDENT_AMBULATORY_CARE_PROVIDER_SITE_OTHER): Payer: BC Managed Care – PPO | Admitting: Family Medicine

## 2013-12-02 VITALS — BP 130/90 | Temp 98.6°F | Ht 64.0 in | Wt 186.0 lb

## 2013-12-02 DIAGNOSIS — R030 Elevated blood-pressure reading, without diagnosis of hypertension: Secondary | ICD-10-CM

## 2013-12-02 DIAGNOSIS — Z23 Encounter for immunization: Secondary | ICD-10-CM

## 2013-12-02 DIAGNOSIS — Z9109 Other allergy status, other than to drugs and biological substances: Secondary | ICD-10-CM

## 2013-12-02 MED ORDER — MONTELUKAST SODIUM 10 MG PO TABS: 10.0000 mg | ORAL_TABLET | Freq: Every day | ORAL | Status: DC

## 2013-12-02 MED ORDER — FLUTICASONE PROPIONATE 50 MCG/ACT NA SUSP: NASAL | Status: DC

## 2013-12-02 NOTE — Progress Notes (Signed)
Pre visit review using our clinic review tool, if applicable. No additional management support is needed unless otherwise documented below in the visit note. 

## 2013-12-02 NOTE — Progress Notes (Signed)
   Subjective:    Patient ID: Jacob Fletcher, male    DOB: October 18, 1970, 43 y.o.   MRN: [id number]  HPI Jacob Fletcher is a 43 year old married male nonsmoker who comes in for general medical exam  He uses Flonase nasal spray along with plain Claritin for allergic rhinitis however sometimes it doesn't help and he has to use the Claritin-D. He's been monitoring his blood pressure at home his blood pressure cuff is really not accurate. BP today 130/90. The slight elevation diastolic may be due to the over-the-counter pseudoephedrine in the Claritin.  Mother has diabetes and hypertension he thinks his father also has hypertension  His can have reversal [facility]  He gets routine eye care, dental care, vaccinations updated by Fleet Contrasachel tetanus booster given    Review of Systems  Constitutional: Negative.   HENT: Negative.   Eyes: Negative.   Respiratory: Negative.   Cardiovascular: Negative.   Gastrointestinal: Negative.   Endocrine: Negative.   Genitourinary: Negative.   Musculoskeletal: Negative.   Skin: Negative.   Allergic/Immunologic: Negative.   Neurological: Negative.   Hematological: Negative.   Psychiatric/Behavioral: Negative.        Objective:   Physical Exam  Nursing note and vitals reviewed. Constitutional: He is oriented to person, place, and time. He appears well-developed and well-nourished.  HENT:  Head: Normocephalic and atraumatic.  Right Ear: External ear normal.  Left Ear: External ear normal.  Nose: Nose normal.  Mouth/Throat: Oropharynx is clear and moist.  Eyes: Conjunctivae and EOM are normal. Pupils are equal, round, and reactive to light.  Neck: Normal range of motion. Neck supple. No JVD present. No tracheal deviation present. No thyromegaly present.  Cardiovascular: Normal rate, regular rhythm, normal heart sounds and intact distal pulses.  Exam reveals no gallop and no friction rub.   No murmur heard. Pulmonary/Chest: Effort normal  and breath sounds normal. No stridor. No respiratory distress. He has no wheezes. He has no rales. He exhibits no tenderness.  Abdominal: Soft. Bowel sounds are normal. He exhibits no distension and no mass. There is no tenderness. There is no rebound and no guarding.  Genitourinary: Penis normal. No penile tenderness.  Musculoskeletal: Normal range of motion. He exhibits no edema and no tenderness.  Lymphadenopathy:    He has no cervical adenopathy.  Neurological: He is alert and oriented to person, place, and time. He has normal reflexes. He displays normal reflexes. No cranial nerve deficit. He exhibits normal muscle tone. Coordination normal.  Skin: Skin is warm and dry. No rash noted. No erythema. No pallor.  Psychiatric: He has a normal mood and affect. His behavior is normal. Judgment and thought content normal.          Assessment & Plan:  Healthy male  Allergic rhinitis add Singulair DCD pseudoephedrine products  Return when necessary

## 2013-12-02 NOTE — Patient Instructions (Signed)
Singulair 10 mg.Marland Kitchen.Marland Kitchen.Marland Kitchen.Marland Kitchen. 1 daily at bedtime  Steroid nasal spray,,,,,,, one-shot each nostril bedtime,  Is try switching to plain 10 mg of Zyrtec,,,,,,,, 1 at bedtime Return yearly for general checkup sooner if any problems

## 2014-02-26 ENCOUNTER — Ambulatory Visit (INDEPENDENT_AMBULATORY_CARE_PROVIDER_SITE_OTHER): Payer: BC Managed Care – PPO | Admitting: Family Medicine

## 2014-02-26 ENCOUNTER — Ambulatory Visit: Payer: BC Managed Care – PPO | Admitting: Family Medicine

## 2014-02-26 ENCOUNTER — Encounter: Payer: Self-pay | Admitting: Family Medicine

## 2014-02-26 VITALS — BP 140/78 | Temp 98.3°F | Wt 184.0 lb

## 2014-02-26 DIAGNOSIS — B356 Tinea cruris: Secondary | ICD-10-CM

## 2014-02-26 MED ORDER — KETOCONAZOLE 2 % EX CREA: 1.0000 "application " | TOPICAL_CREAM | Freq: Every day | CUTANEOUS | Status: DC

## 2014-02-26 NOTE — Patient Instructions (Signed)
Ketoconazole cream........... apply small amounts twice daily

## 2014-02-26 NOTE — Progress Notes (Signed)
Pre visit review using our clinic review tool, if applicable. No additional management support is needed unless otherwise documented below in the visit note. 

## 2014-02-26 NOTE — Progress Notes (Signed)
   Subjective:    Patient ID: Jacob Fletcher, male    DOB: 1971-04-26, 43 y.o.   MRN: [id number]  HPI Jacob Fletcher is a 43 year old male who comes in today for evaluation of a fungal infection in his groin  A couple weeks ago he had his vasectomy reversed at [facility]. He did well until 2 days ago when he noticed a red. Rash in the groin and around the head of the penis. He called his surgeon and Jacob Fletcher who felt like it was probably a fungal infection but wanted [place] to check him   Review of Systems    review of systems negative Objective:   Physical Exam Well-developed well-nourished male in no acute distress vital signs stable he is afebrile examination groin shows 2 well-healed incisions from previous vasectomy reversal. There is redness and swelling around the head of the penis the left coronary       Assessment & Plan:  Fungal infection .........Marland Kitchen Ketoconazole cream

## 2015-01-24 DIAGNOSIS — R55 Syncope and collapse: Secondary | ICD-10-CM | POA: Insufficient documentation

## 2015-01-24 DIAGNOSIS — R531 Weakness: Secondary | ICD-10-CM | POA: Insufficient documentation

## 2015-01-25 DIAGNOSIS — G8220 Paraplegia, unspecified: Secondary | ICD-10-CM | POA: Insufficient documentation

## 2015-01-25 DIAGNOSIS — R509 Fever, unspecified: Secondary | ICD-10-CM | POA: Insufficient documentation

## 2015-01-26 ENCOUNTER — Other Ambulatory Visit: Payer: Self-pay | Admitting: Family Medicine

## 2015-01-26 ENCOUNTER — Encounter: Payer: Self-pay | Admitting: Adult Health

## 2015-01-26 ENCOUNTER — Encounter: Payer: Self-pay | Admitting: Family Medicine

## 2015-01-26 ENCOUNTER — Ambulatory Visit (INDEPENDENT_AMBULATORY_CARE_PROVIDER_SITE_OTHER): Payer: BLUE CROSS/BLUE SHIELD | Admitting: Adult Health

## 2015-01-26 VITALS — BP 110/82 | HR 86 | Temp 97.9°F | Ht 64.0 in | Wt 181.8 lb

## 2015-01-26 DIAGNOSIS — Z09 Encounter for follow-up examination after completed treatment for conditions other than malignant neoplasm: Secondary | ICD-10-CM | POA: Diagnosis not present

## 2015-01-26 DIAGNOSIS — R5383 Other fatigue: Secondary | ICD-10-CM | POA: Diagnosis not present

## 2015-01-26 NOTE — Progress Notes (Addendum)
Subjective:    Patient ID: Jacob Fletcher, male    DOB: January 12, 1971, 44 y.o.   MRN: [id number]  HPI  44 year old relatively healthy male presents to the office for hospital follow up. He was seen at [facility] in [place], [place], where he was seen in the ER on Saturday for paralysis of bilateral lower extremities. Per ER note:  Mr. Linward Foster is a 44 year old male with history of hypertension not treated yet. He is here visiting his wife's family and is brought to the emergency room with increasing lower extremity weakness and inability to walk. Wife is bedside and helps with a history. Apparently this morning on his way to play golf he said he didn't feel very well.. While playing golf he became increasingly weak and had to be helped to get back in the car and go back to the house. Later on in the day he was crawling because he couldn't not walk by the time he arrived to the emergency room he said he could not move his lower extremities some of his exam was unequivocal please refer to the Emergency Department notes. On arrival to the emergency department he had an episode of projectile vomiting. CT of the head abdomen chest and pelvis were unremarkable. At the time of evaluation he became febrile 102 he was unable to move his lower extremities; his upper extremities were also weak but not as much. He then developed diarrhea and added that have been having loose stools earlier. Denies cough or respiratory congestion and shortness of breath. Denies headache, blurred vision, syncopal episode.  He was admitted to the hospital for observation. CT, MRI and X-rays were negative, except for an xray that showed a 'slightely tortuous thoracic aorta'. Blood work was benign except for slight elevated WBC ( 11.4) and Seg neutrophils (86%), UA did show hematuria, but this can be do to the fact that he was straight cathed in the ER.   During his hospital stay, around 4 am Sunday morning he was able to  start using his extremities again,he was able to walk to the bathroom and grip a cup. he felt like he was better so he left AMA without seeing Neurology.   During follow up today he endorses " I just feel tired." He denies having any numbness or tingling in his extremities, but does have a "burning sensation" on the bottom of left foot. He continues to have diarrhea, in which he had 5 episodes today ( C-diff was negative). Denies fever or nausea.    Review of Systems  Constitutional: Positive for activity change, appetite change and fatigue. Negative for fever, chills, diaphoresis and unexpected weight change.  HENT: Negative.   Eyes: Negative.   Respiratory: Negative.   Cardiovascular: Negative.   Gastrointestinal: Positive for diarrhea. Negative for nausea, vomiting and rectal pain.  Endocrine: Negative.   Genitourinary: Negative.   Musculoskeletal: Negative for myalgias, back pain, joint swelling, arthralgias, gait problem, neck pain and neck stiffness.  Skin: Negative.   Neurological: Negative.   Hematological: Negative.   Psychiatric/Behavioral: Negative.        Objective:   Physical Exam  Constitutional: He is oriented to person, place, and time. He appears well-developed and well-nourished. No distress.  Appears tired and worn out  HENT:  Head: Normocephalic and atraumatic.  Right Ear: External ear normal.  Left Ear: External ear normal.  Nose: Nose normal.  Mouth/Throat: Oropharynx is clear and moist. No oropharyngeal exudate.  Eyes: Conjunctivae and  EOM are normal. Pupils are equal, round, and reactive to light. Right eye exhibits no discharge. Left eye exhibits no discharge.  Neck: Normal range of motion. Neck supple. No tracheal deviation present.  Cardiovascular: Normal rate, regular rhythm, normal heart sounds and intact distal pulses.  Exam reveals no gallop and no friction rub.   No murmur heard. Pulmonary/Chest: Effort normal and breath sounds normal. No  respiratory distress. He has no wheezes. He has no rales. He exhibits no tenderness.  Abdominal: Soft. Bowel sounds are normal. He exhibits no mass. There is no tenderness. There is no rebound and no guarding.  Musculoskeletal: Normal range of motion.  No redness, no warmth, no trauma to "burning" area on left planter surface of foot.     Lymphadenopathy:    He has no cervical adenopathy.  Neurological: He is alert and oriented to person, place, and time. Coordination normal.  1+  bilateral patellar and bicep reflex.    Skin: Skin is warm and dry. No rash noted. He is not diaphoretic. No erythema. No pallor.  Psychiatric: He has a normal mood and affect. His behavior is normal. Judgment and thought content normal.  Nursing note and vitals reviewed.      Assessment & Plan:  1. Other fatigue - Ambulatory referral to Neurology - Basic metabolic panel - Rocky mtn spotted fvr abs pnl(IgG+IgM) - B. burgdorfi Antibody - Urinalysis with Reflex Microscopic - CBC with Differential/Platelet - Advised that if he feels worse [facility] - Rest and clear liquid diet for the next 24 hours.  - Will follow up with patient once lab results are back  - Follow up in office as needed.  - Reviewed case with patients PCP MD Todd  2. Hospital discharge follow-up

## 2015-01-27 LAB — URINALYSIS, ROUTINE W REFLEX MICROSCOPIC
Bilirubin Urine: NEGATIVE
Ketones, ur: NEGATIVE
Leukocytes, UA: NEGATIVE
Nitrite: NEGATIVE
PH: 6 (ref 5.0–8.0)
Specific Gravity, Urine: 1.025 (ref 1.000–1.030)
TOTAL PROTEIN, URINE-UPE24: NEGATIVE
Urine Glucose: NEGATIVE
Urobilinogen, UA: 0.2 (ref 0.0–1.0)

## 2015-01-27 LAB — CBC WITH DIFFERENTIAL/PLATELET
Basophils Absolute: 0 10*3/uL (ref 0.0–0.1)
Basophils Relative: 0.4 % (ref 0.0–3.0)
EOS ABS: 0.5 10*3/uL (ref 0.0–0.7)
Eosinophils Relative: 4.6 % (ref 0.0–5.0)
HCT: 48.6 % (ref 39.0–52.0)
Hemoglobin: 16.6 g/dL (ref 13.0–17.0)
LYMPHS ABS: 2.1 10*3/uL (ref 0.7–4.0)
LYMPHS PCT: 21.3 % (ref 12.0–46.0)
MCHC: 34.2 g/dL (ref 30.0–36.0)
MCV: 91.8 fl (ref 78.0–100.0)
MONO ABS: 0.8 10*3/uL (ref 0.1–1.0)
Monocytes Relative: 7.6 % (ref 3.0–12.0)
NEUTROS ABS: 6.6 10*3/uL (ref 1.4–7.7)
NEUTROS PCT: 66.1 % (ref 43.0–77.0)
Platelets: 229 10*3/uL (ref 150.0–400.0)
RBC: 5.3 Mil/uL (ref 4.22–5.81)
RDW: 12.9 % (ref 11.5–15.5)
WBC: 9.9 10*3/uL (ref 4.0–10.5)

## 2015-01-27 LAB — BASIC METABOLIC PANEL
BUN: 9 mg/dL (ref 6–23)
CALCIUM: 9.6 mg/dL (ref 8.4–10.5)
CHLORIDE: 106 meq/L (ref 96–112)
CO2: 28 meq/L (ref 19–32)
CREATININE: 1.03 mg/dL (ref 0.40–1.50)
GFR: 83.86 mL/min (ref >60.00)
Glucose, Bld: 100 mg/dL — ABNORMAL HIGH (ref 70–99)
Potassium: 4.4 mEq/L (ref 3.5–5.1)
Sodium: 140 mEq/L (ref 135–145)

## 2015-01-27 LAB — ROCKY MTN SPOTTED FVR ABS PNL(IGG+IGM)
RMSF IGM: 0.39 IV
RMSF IgG: 0.12 IV

## 2015-01-27 LAB — B. BURGDORFI ANTIBODIES: B BURGDORFERI AB IGG+ IGM: 0.6 {ISR}

## 2015-01-30 ENCOUNTER — Ambulatory Visit (INDEPENDENT_AMBULATORY_CARE_PROVIDER_SITE_OTHER): Payer: BLUE CROSS/BLUE SHIELD | Admitting: Neurology

## 2015-01-30 ENCOUNTER — Encounter: Payer: Self-pay | Admitting: Neurology

## 2015-01-30 VITALS — BP 118/84 | HR 76 | Resp 16 | Ht 66.0 in | Wt 182.0 lb

## 2015-01-30 DIAGNOSIS — B349 Viral infection, unspecified: Secondary | ICD-10-CM | POA: Insufficient documentation

## 2015-01-30 DIAGNOSIS — R295 Transient paralysis: Secondary | ICD-10-CM | POA: Diagnosis not present

## 2015-01-30 NOTE — Progress Notes (Signed)
NEUROLOGY CONSULTATION NOTE  KEYLON LABELLE MRN: [id number] DOB: 05-Jun-1971  Referring provider: Shirline Frees, NP Primary care provider: Kelle Darting, MD  Reason for consult:  Transient paralysis  HISTORY OF PRESENT ILLNESS: Jacob Fletcher is a 44 year old right-handed man with hypertension who presents with lower extremity weakness.  Records, labs, EKG, MRI and CT reports reviewed.  He is accompanied by his wife, who provides some history.  Last weekend, he was in [place] with his family.  On Friday night, he felt fine.  He had a big meal for dinner.  When he woke up the next morning, he did not feel well.  His stomach was upset and he had diarrhea.  His head felt strange but he did not have a headache.  He went to play golf.  While playing golf, he gradually felt more lethargic and weaker.  He went back to the house.  Nobody was home, so he took a nap.  Eventually, he woke up in bed and was unable to move his body.  He couldn't open his eyes or move his extremities.  He was able to moan and mumble but not able to speak.  He was aware and able to hear.  He laid there for an unknown amount of time until his family members came home, however he was unable to call out to them.  Eventually, he was able to roll out of bed and walk down the hall.  He suddenly became weaker again and collapsed on the floor.  He was completely paralyzed on the floor.  His family called EMG and he was brought to [facility].  In the ED, he had projectile vomiting.  He suddenly felt cold and began to shiver.  He had a fever of 102.  He denied myalgias.  He had some right sided abdominal pain but no severe or specific back pain.  He noted numbness and tingling involving his fingers and toes, however he reports he has always experienced this from time to timeHe was admitted for testing and observation.  MRI of the brain and cervical spine were unremarkable.  CT of head, chest, abdomen and pelvis were unremarkable.   CXR was clear.  EKG was normal.  He had WBC of 11.4 with seg neutrophils of 86%.  UA revealed some hematuria due to trauma from the straight cath, but no infection.  Tox screen was negative.  K was normal at 3.7 to 3.9.  Lactic acid was 1, TSH 0.74.  Shiga toxin, C. Diff and Campylobacter were negative.  He was given antibiotics.  In the middle of the night, he woke up and was able to move again.  He left the hospital AMA.  He then followed up with his PCP on Monday.  He was found to be hyporeflexive.  UA, CBC with diff, and BMP were unremarkable.  RMSF and Lyme antibodies were negative.  He feels a little fatigued and has some decreased appetite.  On a couple of occasions, he felt lightheaded.  He denies palpitations. He is not more diaphoretic than usual.  He reports a small area of pain on his left foot.   PAST MEDICAL HISTORY: Past Medical History  Diagnosis Date  . Allergy   . Asthma     PAST SURGICAL HISTORY: Past Surgical History  Procedure Laterality Date  . Cholecystectomy    . Vasectomy    . Vasectomy reversal    . Esophageal dilation      MEDICATIONS: Current Outpatient  Prescriptions on File Prior to Visit  Medication Sig Dispense Refill  . albuterol (PROAIR HFA) 108 (90 BASE) MCG/ACT inhaler Inhale 2 puffs into the lungs every 4 (four) hours as needed for wheezing or shortness of breath. 1 Inhaler 11  . fluticasone (FLONASE) 50 MCG/ACT nasal spray 1 spray up each nostril once daily at bedtime 32 g 6  . loratadine-pseudoephedrine (CLARITIN-D 24-HOUR) 10-240 MG per 24 hr tablet Take 1 tablet by mouth daily. 90 tablet 0   No current facility-administered medications on file prior to visit.    ALLERGIES: No Known Allergies  FAMILY HISTORY: Family History  Problem Relation Age of Onset  . Asthma Paternal Grandmother   . Diabetes Mother   . Cancer Sister     thyroid  . Thyroid disease Mother   . Hypertension Mother   . Hypertension Father     SOCIAL HISTORY: History     Social History  . Marital Status: Married    Spouse Name: N/A  . Number of Children: N/A  . Years of Education: N/A   Occupational History  . IT    Social History Main Topics  . Smoking status: Former Smoker    Types: Cigarettes  . Smokeless tobacco: Never Used  . Alcohol Use: 0.0 oz/week    0 Standard drinks or equivalent per week     Comment: every 3 weeks  . Drug Use: No  . Sexual Activity: Not on file   Other Topics Concern  . Not on file   Social History Narrative    REVIEW OF SYSTEMS: Constitutional: Fatigue, decreased appetite.  No fever. Eyes: No visual changes, double vision, eye pain Ear, nose and throat: No hearing loss, ear pain, nasal congestion, sore throat Cardiovascular: No chest pain, palpitations Respiratory:  No shortness of breath at rest or with exertion, wheezes GastrointestinaI: No nausea, vomiting, diarrhea, abdominal pain, fecal incontinence Genitourinary:  No dysuria, urinary retention or frequency Musculoskeletal:  No neck pain, back pain Integumentary: No rash, pruritus, skin lesions Neurological: as above Psychiatric: No depression, insomnia, anxiety Endocrine: No palpitations, mood swings, change in appetite, change in weight, increased thirst Hematologic/Lymphatic:  No anemia, purpura, petechiae. Allergic/Immunologic: no itchy/runny eyes, nasal congestion, recent allergic reactions, rashes  PHYSICAL EXAM: Filed Vitals:   01/30/15 1234  BP: 118/84  Pulse: 76  Resp: 16   General: No acute distress Head:  Normocephalic/atraumatic Eyes:  fundi unremarkable, without vessel changes, exudates, hemorrhages or papilledema. Neck: supple, no paraspinal tenderness, full range of motion Back: No paraspinal tenderness Heart: regular rate and rhythm Lungs: Clear to auscultation bilaterally. Vascular: No carotid bruits. Neurological Exam: Mental status: alert and oriented to person, place, and time, recent and remote memory intact, fund of  knowledge intact, attention and concentration intact, speech fluent and not dysarthric, language intact. Cranial nerves: CN I: not tested CN II: pupils equal, round and reactive to light, visual fields intact, fundi unremarkable, without vessel changes, exudates, hemorrhages or papilledema. CN III, IV, VI:  full range of motion, no nystagmus, no ptosis CN V: facial sensation intact CN VII: upper and lower face symmetric CN VIII: hearing intact CN IX, X: gag intact, uvula midline CN XI: sternocleidomastoid and trapezius muscles intact CN XII: tongue midline Bulk & Tone: normal, no fasciculations. Motor:  5/5 throughoutt Sensation:  Pinprick and vibration sensation intact. Deep Tendon Reflexes:  2+ throughout, toes downgoing. Finger to nose testing:  No dysmetria Heel to shin:  No dysmetria Gait:  Normal station and stride.  Able  to turn, walk on toes, heels and in tandem. Romberg negative.  IMPRESSION & PLAN: Acute viral illness with transient paralysis.  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  It is unusual presentation that it was so sudden onset, brief, and the viral symptoms coincided with onset of the weakness.  I suspect this is a monophasic course and he will unlikely have a recurrence.  Since he has significantly improved (as demonstrated by intact strength and normal reflexes), I would not pursue other testing such as lumbar puncture or nerve conduction studies.  If symptoms recur, he should go to the ED, particularly since it was such as rapid progression.  If he has any other questions or symptoms that need to be evaluated, he was instructed to call the office.  Thank you for allowing me to take part in the care of this patient.  Shon MilletAdam Daveigh Batty, DO  CC:  Shirline Freesory Nafziger, NP  Kelle DartingJeffrey Todd, MD

## 2015-01-30 NOTE — Patient Instructions (Signed)
I suspect you had an acute viral illness which affected your nerves, causing weakness and paralysis.  A similar syndrome is called Guillain Barre Syndrome.  However, you had an atypical presentation because it was so sudden-onset and only lasted a few hours.  If we were to do further workup, it would involve a spinal tap or nerve conduction study.  However, since you are doing well, I wouldn't pursue this.  I don't think you should have a recurrence.  But if you do, go to the emergency room since you had such a sudden onset of symptoms.  Call with any questions or concerns or if you have any other symptoms that you need me to look over.

## 2015-06-22 ENCOUNTER — Telehealth: Payer: Self-pay | Admitting: Family Medicine

## 2015-06-22 NOTE — Telephone Encounter (Signed)
Per Dr Tawanna Cooler patient should call urology.  Patient is aware.

## 2015-06-22 NOTE — Telephone Encounter (Signed)
Having a dull intermittent pain in his left testicle for a few weeks. Had a vasectomy reversal in May 2015. Would like to know if he needs to see Dr Tawanna Cooler or have an ultrasound or whet your advice would be.

## 2016-06-28 ENCOUNTER — Telehealth: Payer: Self-pay | Admitting: Family Medicine

## 2016-06-28 ENCOUNTER — Emergency Department (HOSPITAL_COMMUNITY)
Admission: EM | Admit: 2016-06-28 | Discharge: 2016-06-28 | Disposition: A | Discharge status: Home / Self Care | Payer: 59 | Attending: Dermatology | Admitting: Dermatology

## 2016-06-28 ENCOUNTER — Encounter (HOSPITAL_COMMUNITY): Payer: Self-pay

## 2016-06-28 ENCOUNTER — Encounter: Payer: Self-pay | Admitting: Family Medicine

## 2016-06-28 DIAGNOSIS — Z5321 Procedure and treatment not carried out due to patient leaving prior to being seen by health care provider: Secondary | ICD-10-CM | POA: Insufficient documentation

## 2016-06-28 DIAGNOSIS — Z79899 Other long term (current) drug therapy: Secondary | ICD-10-CM | POA: Diagnosis not present

## 2016-06-28 DIAGNOSIS — Z87891 Personal history of nicotine dependence: Secondary | ICD-10-CM | POA: Insufficient documentation

## 2016-06-28 DIAGNOSIS — J45909 Unspecified asthma, uncomplicated: Secondary | ICD-10-CM | POA: Diagnosis not present

## 2016-06-28 DIAGNOSIS — K625 Hemorrhage of anus and rectum: Secondary | ICD-10-CM | POA: Diagnosis not present

## 2016-06-28 NOTE — Telephone Encounter (Signed)
[facility] - Client  TELEPHONE ADVICE RECORD   [facility]    --------------------------------------------------------------------------------   Patient Name: [name]  Gender: Male  DOB: [birth year]   Age: [age] Y 2 M 7 D  Return Phone Number: [phone]) [phone] (Primary)  Address:     City/State/Zip:  [place]     Client [facility] - Client  Client Site [facility]  Physician Alonza Smokerodd, Jeff - MD  Contact Type Call  Who Is Calling Patient / Member / Family / Caregiver  Call Type Triage / Clinical  Relationship To Patient Self  Return Phone Number [phone] (Primary)  Chief Complaint Rectal Bleeding  Reason for Call Symptomatic / Request for Health Information  Initial Comment Caller states, he has been having hemorrhoid issues- he is having anal bleeding. Verified.   Appointment Disposition EMR Appointment Not Necessary  Info pasted into Epic Yes  PreDisposition Call Doctor  Translation No       Nurse Assessment  Nurse: Sabino GasserMullins, RN, Melissa Date/Time (Eastern Time): 06/28/2016 3:03:00 PM  Confirm and document reason for call. If symptomatic, describe symptoms. You must click the next button to save text entered. ---Caller states, he has been having hemorrhoid issues- he is having anal bleeding. Verified.    Has the patient traveled out of the country within the last 30 days? ---Not Applicable    Does the patient have any new or worsening symptoms? ---Yes    Will a triage be completed? ---Yes    Related visit to physician within the last 2 weeks? ---No    Does the PT have any chronic conditions? (i.e. diabetes, asthma, etc.) ---No    Is this a behavioral health or substance abuse call? ---No           Guidelines          Guideline Title Affirmed Question Affirmed Notes Nurse Date/Time [name]  Rectal Bleeding [1] MODERATE rectal bleeding (small blood clots, passing blood  without stool, or toilet water turns red) AND [2] more than once a day    Sabino GasserMullins, RN, [facility] 06/28/2016 3:03:26 PM    Disp. Time [name] Disposition Final User         06/28/2016 3:09:59 PM Go to ED Now Yes Sabino GasserMullins, RN, [name]            Caller Understands: Yes  Disagree/Comply: Comply       Care Advice Given Per Guideline        GO TO ED NOW: You need to be seen in the Emergency Department. Go to the ER at _________[facility] Hospital. Leave now. Drive carefully. DRIVING: Another adult should drive. Do not delay going to the Emergency Department. If immediate transportation is not available via car or taxi, then the patient should be instructed to call [id number].        --------------------------------------------------------------------------------         Comments  User: [name], [name], RN Date/Time (Eastern Time): 06/28/2016 3:09:54 PM  Caller reports he is having problems with hemorrhoids, has been going on for 10 days which he has used Preparation H for, today he has noticed he has bled through his pants and onto the seat at work. Caller reports he can not stop the bleeding.    [facility]  [facility]

## 2016-06-28 NOTE — Telephone Encounter (Signed)
Noted. Patient checked into ED. 

## 2016-06-28 NOTE — ED Triage Notes (Signed)
Pt has hx of hemorrhoid.  Called MD today b/c he has had rectal bleeding. Cyst appears to have come up and site is now painful.  MD told him to come here.

## 2016-06-30 ENCOUNTER — Ambulatory Visit (INDEPENDENT_AMBULATORY_CARE_PROVIDER_SITE_OTHER): Payer: 59 | Admitting: Adult Health

## 2016-06-30 ENCOUNTER — Encounter: Payer: Self-pay | Admitting: Adult Health

## 2016-06-30 VITALS — BP 124/64 | Ht 65.0 in | Wt 174.6 lb

## 2016-06-30 DIAGNOSIS — K649 Unspecified hemorrhoids: Secondary | ICD-10-CM

## 2016-06-30 NOTE — Progress Notes (Signed)
Subjective:    Patient ID: Jacob Fletcher, male    DOB: 20-Jun-1971, 45 y.o.   MRN: [id number]  HPI  45 year old male who presents to the office today for an acute complaint of bleeding hemorrhoid for the last week. He reports that at the beginning of the week he noticed rectal pain and itching. As thr week went on he noticed that the hemorrhoid was growing larger and then " burst", he reports that when the hemorrhoid "burst" the blood went through his underwear, his pants, a towel and onto his chair. He has been wearing a maxi pad and since that time the bleeding has almost stopped and he endorses less pain. He feels as though the hemorrhoid has become smaller.   He has been using preparation H cream, pads, and ice.     Review of Systems  Constitutional: Negative.   Respiratory: Negative.   Cardiovascular: Negative.   Gastrointestinal: Positive for anal bleeding and rectal pain. Negative for abdominal distention, abdominal pain, nausea and vomiting.  Musculoskeletal: Negative.   All other systems reviewed and are negative.  Past Medical History:  Diagnosis Date  . Allergy   . Asthma     Social History   Social History  . Marital status: Married    Spouse name: N/A  . Number of children: N/A  . Years of education: N/A   Occupational History  . IT    Social History Main Topics  . Smoking status: Former Smoker    Types: Cigarettes  . Smokeless tobacco: Never Used  . Alcohol use 0.0 oz/week     Comment: every 3 weeks  . Drug use: No  . Sexual activity: Not on file   Other Topics Concern  . Not on file   Social History Narrative  . No narrative on file    Past Surgical History:  Procedure Laterality Date  . CHOLECYSTECTOMY    . ESOPHAGEAL DILATION    . VASECTOMY    . VASECTOMY REVERSAL      Family History  Problem Relation Age of Onset  . Asthma Paternal Grandmother   . Diabetes Mother   . Thyroid disease Mother   . Hypertension Mother   . Cancer  Sister     thyroid  . Hypertension Father     No Known Allergies  Current Outpatient Prescriptions on File Prior to Visit  Medication Sig Dispense Refill  . albuterol (PROAIR HFA) 108 (90 BASE) MCG/ACT inhaler Inhale 2 puffs into the lungs every 4 (four) hours as needed for wheezing or shortness of breath. 1 Inhaler 11  . loratadine-pseudoephedrine (CLARITIN-D 24-HOUR) 10-240 MG per 24 hr tablet Take 1 tablet by mouth daily. 90 tablet 0   No current facility-administered medications on file prior to visit.     BP 124/64   Ht 5\' 5"  (1.651 m)   Wt 174 lb 9.6 oz (79.2 kg)   BMI 29.05 kg/m       Objective:   Physical Exam  Constitutional: He is oriented to person, place, and time. He appears well-developed and well-nourished. No distress.  Abdominal: Soft. Bowel sounds are normal. He exhibits no distension and no mass. There is no tenderness. There is no rebound and no guarding.  Genitourinary: Rectal exam shows external hemorrhoid (large engorged external hemorrhoid) and tenderness.  Neurological: He is alert and oriented to person, place, and time.  Skin: Skin is warm and dry. No rash noted. He is not diaphoretic. No erythema. No pallor.  Psychiatric: He has a normal mood and affect. His behavior is normal. Judgment and thought content normal.  Nursing note and vitals reviewed.      Assessment & Plan:  1. Bleeding hemorrhoid - Advised lancing in the office. Patient declined at this time. " I have to get some things done today." He would also like to see GI for possible banding.  - Advised to follow up hemorrhoid gets larger and more painful.  - Will refer to GI  - Continue with Preperation H and sitz baths.   Shirline Frees, NP

## 2016-07-04 ENCOUNTER — Ambulatory Visit (INDEPENDENT_AMBULATORY_CARE_PROVIDER_SITE_OTHER): Payer: 59 | Admitting: Internal Medicine

## 2016-07-04 ENCOUNTER — Encounter (INDEPENDENT_AMBULATORY_CARE_PROVIDER_SITE_OTHER): Payer: Self-pay

## 2016-07-04 ENCOUNTER — Encounter: Payer: Self-pay | Admitting: Internal Medicine

## 2016-07-04 VITALS — BP 110/70 | HR 77 | Ht 65.0 in | Wt 174.0 lb

## 2016-07-04 DIAGNOSIS — K645 Perianal venous thrombosis: Secondary | ICD-10-CM

## 2016-07-04 NOTE — Progress Notes (Signed)
HISTORY OF PRESENT ILLNESS:  Jacob BlalockRichard E Boden is a 45 y.o. male who is referred by Angela Adamory Nafzinger NP regarding symptomatic hemorrhoids. Patient reports little over a week ago developing rectal pain associated with enlarged firm tender hemorrhoid. Subsequent spontaneous rupture with moderate improvement in pain but subsequent recurrent bleeding. Evaluated at his PCPs office. Preparation H and sitz baths recommended. Referral to GI for "possible banding". Feeling better at this time with minimal bleeding after wiping from bowel movement  REVIEW OF SYSTEMS:  All non-GI ROS negative upon comprehensive review  Past Medical History:  Diagnosis Date  . Allergy   . Asthma     Past Surgical History:  Procedure Laterality Date  . CHOLECYSTECTOMY    . ESOPHAGEAL DILATION    . VASECTOMY    . VASECTOMY REVERSAL      Social History Jacob BlalockRichard E Lundin  reports that he has quit smoking. His smoking use included Cigarettes. He has never used smokeless tobacco. He reports that he drinks alcohol. He reports that he does not use drugs.  family history includes Asthma in his paternal grandmother; Cancer in his sister; Diabetes in his mother; Hypertension in his father and mother; Prostate cancer in his maternal uncle; Thyroid disease in his mother.  No Known Allergies     PHYSICAL EXAMINATION: Vital signs: BP 110/70 (BP Location: Right Arm, Patient Position: Sitting, Cuff Size: Normal)   Pulse 77   Ht 5\' 5"  (1.651 m)   Wt 174 lb (78.9 kg)   BMI 28.96 kg/m  General: Well-developed, well-nourished, no acute distress HEENT: Sclerae are anicteric,  Abdomen: Not examined Rectal: Small external hemorrhoid tag at 3:00. Moderate size external hemorrhoid with superficial ulceration at 9:00  Extremities: No edema Psychiatric: alert and oriented x3. Cooperative   ASSESSMENT:  #1. Thrombosed external hemorrhoid with spontaneous rupture  PLAN:  #1. Discussed thrombosed external hemorrhoid. Banding  not appropriate. Discussed surgical hemorrhoidectomy as an option but not necessary my opinion at present #2. Daily fiber supplementation with Metamucil or equivalent #3. Sitz baths until better #4. Preparation H until better #5. GI follow-up as needed  A copy of this consultation note has been [facility] to [facility]

## 2016-07-04 NOTE — Patient Instructions (Signed)
Please take your fiber supplement daily.  We recommend sitz baths to help with your hemorrhoids. We have given you a handout on this.  Use Preparation H (over the counter) to apply to hemorrhoids as needed.  If you are age 44 or older, your body mass index should be between 23-30. Your Body mass index is 28.96 kg/m. If this is out of the aforementioned range listed, please consider follow up with your Primary Care Provider.  If you are age 44 or younger, your body mass index should be between 19-25. Your Body mass index is 28.96 kg/m. If this is out of the aformentioned range listed, please consider follow up with your Primary Care Provider.

## 2017-04-10 ENCOUNTER — Encounter: Payer: Self-pay | Admitting: Family Medicine

## 2017-04-10 ENCOUNTER — Ambulatory Visit (INDEPENDENT_AMBULATORY_CARE_PROVIDER_SITE_OTHER): Payer: 59 | Admitting: Family Medicine

## 2017-04-10 VITALS — BP 128/88 | HR 92 | Temp 98.4°F | Wt 170.0 lb

## 2017-04-10 DIAGNOSIS — R0789 Other chest pain: Secondary | ICD-10-CM | POA: Diagnosis not present

## 2017-04-10 NOTE — Patient Instructions (Signed)
Nonspecific Chest Pain Chest pain can be caused by many different conditions. There is always a chance that your pain could be related to something serious, such as a heart attack or a blood clot in your lungs. Chest pain can also be caused by conditions that are not life-threatening. If you have chest pain, it is very important to follow up with your health care provider. What are the causes? Causes of this condition include:  Heartburn.  Pneumonia or bronchitis.  Anxiety or stress.  Inflammation around your heart (pericarditis) or lung (pleuritis or pleurisy).  A blood clot in your lung.  A collapsed lung (pneumothorax). This can develop suddenly on its own (spontaneous pneumothorax) or from trauma to the chest.  Shingles infection (varicella-zoster virus).  Heart attack.  Damage to the bones, muscles, and cartilage that make up your chest wall. This can include: ? Bruised bones due to injury. ? Strained muscles or cartilage due to frequent or repeated coughing or overwork. ? Fracture to one or more ribs. ? Sore cartilage due to inflammation (costochondritis).  What increases the risk? Risk factors for this condition may include:  Activities that increase your risk for trauma or injury to your chest.  Respiratory infections or conditions that cause frequent coughing.  Medical conditions or overeating that can cause heartburn.  Heart disease or family history of heart disease.  Conditions or health behaviors that increase your risk of developing a blood clot.  Having had chicken pox (varicella zoster).  What are the signs or symptoms? Chest pain can feel like:  Burning or tingling on the surface of your chest or deep in your chest.  Crushing, pressure, aching, or squeezing pain.  Dull or sharp pain that is worse when you move, cough, or take a deep breath.  Pain that is also felt in your back, neck, shoulder, or arm, or pain that spreads to any of these  areas.  Your chest pain may come and go, or it may stay constant. How is this diagnosed? Lab tests or other studies may be needed to find the cause of your pain. Your health care provider may have you take a test called an ECG (electrocardiogram). An ECG records your heartbeat patterns at the time the test is performed. You may also have other tests, such as:  Transthoracic echocardiogram (TTE). In this test, sound waves are used to create a picture of the heart structures and to look at how blood flows through your heart.  Transesophageal echocardiogram (TEE).This is a more advanced imaging test that takes images from inside your body. It allows your health care provider to see your heart in finer detail.  Cardiac monitoring. This allows your health care provider to monitor your heart rate and rhythm in real time.  Holter monitor. This is a portable device that records your heartbeat and can help to diagnose abnormal heartbeats. It allows your health care provider to track your heart activity for several days, if needed.  Stress tests. These can be done through exercise or by taking medicine that makes your heart beat more quickly.  Blood tests.  Other imaging tests.  How is this treated? Treatment depends on what is causing your chest pain. Treatment may include:  Medicines. These may include: ? Acid blockers for heartburn. ? Anti-inflammatory medicine. ? Pain medicine for inflammatory conditions. ? Antibiotic medicine, if an infection is present. ? Medicines to dissolve blood clots. ? Medicines to treat coronary artery disease (CAD).  Supportive care for conditions that   do not require medicines. This may include: ? Resting. ? Applying heat or cold packs to injured areas. ? Limiting activities until pain decreases.  Follow these instructions at home: Medicines  If you were prescribed an antibiotic, take it as told by your health care provider. Do not stop taking the  antibiotic even if you start to feel better.  Take over-the-counter and prescription medicines only as told by your health care provider. Lifestyle  Do not use any products that contain nicotine or tobacco, such as cigarettes and e-cigarettes. If you need help quitting, ask your health care provider.  Do not drink alcohol.  Make lifestyle changes as directed by your health care provider. These may include: ? Getting regular exercise. Ask your health care provider to suggest some activities that are safe for you. ? Eating a heart-healthy diet. A registered dietitian can help you to learn healthy eating options. ? Maintaining a healthy weight. ? Managing diabetes, if necessary. ? Reducing stress, such as with yoga or relaxation techniques. General instructions  Avoid any activities that bring on chest pain.  If heartburn is the cause for your chest pain, raise (elevate) the head of your bed about 6 inches (15 cm) by putting blocks under the legs. Sleeping with more pillows does not effectively relieve heartburn because it only changes the position of your head.  Keep all follow-up visits as told by your health care provider. This is important. This includes any further testing if your chest pain does not go away. Contact a health care provider if:  Your chest pain does not go away.  You have a rash with blisters on your chest.  You have a fever.  You have chills. Get help right away if:  Your chest pain is worse.  You have a cough that gets worse, or you cough up blood.  You have severe pain in your abdomen.  You have severe weakness.  You faint.  You have sudden, unexplained chest discomfort.  You have sudden, unexplained discomfort in your arms, back, neck, or jaw.  You have shortness of breath at any time.  You suddenly start to sweat, or your skin gets clammy.  You feel nauseous or you vomit.  You suddenly feel light-headed or dizzy.  Your heart begins to beat  quickly, or it feels like it is skipping beats. These symptoms may represent a serious problem that is an emergency. Do not wait to see if the symptoms will go away. Get medical help right away. Call your local emergency services (911 in the [place] Do not drive yourself to the hospital. This information is not intended to replace advice given to you by your health care provider. Make sure you discuss any questions you have with your health care provider. Document Released: 06/29/2005 Document Revised: 06/13/2016 Document Reviewed: 06/13/2016 Elsevier Interactive Patient Education  2017 [employer]  Consider OTC Zantac or Pepcid for any recurrent substernal burning.   We will set up cardiac stress test to further evaluate.

## 2017-04-10 NOTE — Progress Notes (Signed)
Subjective:     Patient ID: Jacob Fletcher, male   DOB: [birth year], 46 y.o.   MRN: [id number]  HPI Patient seen as a work in with episode of what he described as "heartburn" yesterday around 1 PM. He states that he developed cold-like symptoms week ago and really did not eat much during the week. He recalls having 3 egg whites yesterday morning. Around 1 PM he developed a burning sensation substernally which radiated up to the upper part of the esophagus. He noticed some diaphoresis and apparently took several Rolaids along with some baking soda and eventually after about 40 minutes symptoms subsided. No left arm pain. No neck pain. Minimal caffeine use. Previous cholecystectomy.  Patient's had no chest pain today. Nonsmoker. No hx of diabetes. Does have history of dyslipidemia with high triglycerides and low HDL. Family history of CAD in an uncle. He did have elevated blood pressure by his wife taking this yesterday at home 180/120 but this was during his symptoms. He's had some increased fatigue over the past week. No recent exertional chest pains  Patient does relate that he had what sounds like esophageal dilatation around age 46 but his had no recurrence of problems whatsoever with swallowing since then. He states he has not had any reflux symptoms in about 8 years  Past Medical History:  Diagnosis Date  . Allergy   . Asthma    Past Surgical History:  Procedure Laterality Date  . CHOLECYSTECTOMY    . ESOPHAGEAL DILATION    . VASECTOMY    . VASECTOMY REVERSAL      reports that he has quit smoking. His smoking use included Cigarettes. He has never used smokeless tobacco. He reports that he drinks alcohol. He reports that he does not use drugs. family history includes Asthma in his paternal grandmother; Cancer in his sister; Diabetes in his mother; Hypertension in his father and mother; Prostate cancer in his maternal uncle; Thyroid disease in his mother. No Known Allergies   Review of  Systems  Constitutional: Negative for appetite change and unexpected weight change.  HENT: Positive for congestion.   Respiratory: Negative for cough.   Cardiovascular: Positive for chest pain. Negative for palpitations and leg swelling.  Gastrointestinal: Negative for abdominal pain, nausea and vomiting.       Objective:   Physical Exam  Constitutional: He appears well-developed and well-nourished.  Cardiovascular: Normal rate and regular rhythm.  Exam reveals no gallop.   No murmur heard. Pulmonary/Chest: Effort normal and breath sounds normal. No respiratory distress. He has no wheezes. He has no rales.  Abdominal: Soft. There is no tenderness.  Musculoskeletal: He exhibits no edema.       Assessment:     Atypical chest pain at rest. Suspect GERD related.  Patient at fairly low risk overall. EKG today shows normal sinus rhythm with no acute changes    Plan:     -Check EKG (as above=no acute change) -Consider over-the-counter Zantac or Pepcid for any future symptoms -We discussed present cons of further evaluation and have decided on going ahead with exercise tolerance test since he does not typically get very frequent reflux -Follow-up immediately for any recurrent chest pain, dyspnea, or other concerns  Kristian CoveyBruce W Burchette MD [facility]

## 2017-04-14 ENCOUNTER — Telehealth: Payer: Self-pay

## 2017-04-14 NOTE — Telephone Encounter (Signed)
Patient called with c/o continued chest pain that is mostly between his sternum and under his left breast. He denies any upper area chest pain or any radiating pain to shoulder/jaw. He also denies any headache, blurry vision or heart palpitations. He states that he "has been eating Rolaids for the past hour" with no improvement. He has not started taking a daily anti-reflux medication. He would like to know what you would recommend or if there is something stronger than OTC medications you could give him.  Dr. Caryl NeverBurchette - Please advise. Thanks!

## 2017-04-14 NOTE — Telephone Encounter (Signed)
Spoke with patient. He has burning sensation typical to what he had the other day. He took over-the-counter Pepcid which was 10 mg. Has not tried any proton pump inhibitor. Denies any melena. No hematemesis. No exertional symptoms. No dyspnea.  We advised that he increase the Pepcid to 20 mg twice daily and go ahead and start omeprazole over-the-counter 20 mg daily follow-up immediately for any melena, hematemesis, or for any persistent or progressive pain. We have set up exercise tolerance test but this is in the process of approval and he has not heard back yet.  He is advised to go to the ER promptly for any progressive pain, shortness of breath, or chest pain

## 2017-04-27 ENCOUNTER — Ambulatory Visit (INDEPENDENT_AMBULATORY_CARE_PROVIDER_SITE_OTHER): Payer: 59

## 2017-04-27 DIAGNOSIS — R0789 Other chest pain: Secondary | ICD-10-CM | POA: Diagnosis not present

## 2017-04-27 LAB — EXERCISE TOLERANCE TEST
CHL CUP MPHR: 175 {beats}/min
CHL CUP RESTING HR STRESS: 88 {beats}/min
CHL RATE OF PERCEIVED EXERTION: 15
CSEPEDS: 0 s
CSEPPHR: 169 {beats}/min
Estimated workload: 11.7 METS
Exercise duration (min): 10 min
Percent HR: 96 %

## 2018-05-29 ENCOUNTER — Telehealth: Payer: Self-pay | Admitting: Family Medicine

## 2018-05-29 NOTE — Telephone Encounter (Signed)
Copied from CRM [id number]. Topic: Quick Communication - Lab Results >> May 29, 2018  7:35 AM Crist InfanteHarrald, Kathy J wrote: Pt states he faxed his lab results and attn Dr Tawanna Coolerodd.  Pt has appt next wed, 9/04 with Dr Tawanna Coolerodd to discuss. Would like you to be on the lookout for them. From [facility]. [facility]

## 2018-05-30 NOTE — Telephone Encounter (Signed)
Awaiting on the fax

## 2018-06-06 ENCOUNTER — Encounter: Payer: Self-pay | Admitting: Family Medicine

## 2018-06-06 ENCOUNTER — Ambulatory Visit (INDEPENDENT_AMBULATORY_CARE_PROVIDER_SITE_OTHER): Payer: 59 | Admitting: Family Medicine

## 2018-06-06 VITALS — BP 140/100 | HR 83 | Temp 98.1°F | Ht 65.0 in | Wt 190.3 lb

## 2018-06-06 DIAGNOSIS — R945 Abnormal results of liver function studies: Secondary | ICD-10-CM | POA: Diagnosis not present

## 2018-06-06 DIAGNOSIS — R030 Elevated blood-pressure reading, without diagnosis of hypertension: Secondary | ICD-10-CM

## 2018-06-06 DIAGNOSIS — R7989 Other specified abnormal findings of blood chemistry: Secondary | ICD-10-CM

## 2018-06-06 LAB — CBC WITH DIFFERENTIAL/PLATELET
BASOS PCT: 0.7 % (ref 0.0–3.0)
Basophils Absolute: 0 10*3/uL (ref 0.0–0.1)
Eosinophils Absolute: 0.7 10*3/uL (ref 0.0–0.7)
Eosinophils Relative: 11 % — ABNORMAL HIGH (ref 0.0–5.0)
HEMATOCRIT: 48.3 % (ref 39.0–52.0)
HEMOGLOBIN: 16.7 g/dL (ref 13.0–17.0)
LYMPHS PCT: 41.1 % (ref 12.0–46.0)
Lymphs Abs: 2.7 10*3/uL (ref 0.7–4.0)
MCHC: 34.5 g/dL (ref 30.0–36.0)
MCV: 91.4 fl (ref 78.0–100.0)
Monocytes Absolute: 0.5 10*3/uL (ref 0.1–1.0)
Monocytes Relative: 7 % (ref 3.0–12.0)
Neutro Abs: 2.6 10*3/uL (ref 1.4–7.7)
Neutrophils Relative %: 40.2 % — ABNORMAL LOW (ref 43.0–77.0)
Platelets: 236 10*3/uL (ref 150.0–400.0)
RBC: 5.28 Mil/uL (ref 4.22–5.81)
RDW: 13.6 % (ref 11.5–15.5)
WBC: 6.5 10*3/uL (ref 4.0–10.5)

## 2018-06-06 LAB — HEPATIC FUNCTION PANEL
ALBUMIN: 4.6 g/dL (ref 3.5–5.2)
ALT: 77 U/L — ABNORMAL HIGH (ref 0–53)
AST: 34 U/L (ref 0–37)
Alkaline Phosphatase: 68 U/L (ref 39–117)
Bilirubin, Direct: 0.1 mg/dL (ref 0.0–0.3)
Total Bilirubin: 0.8 mg/dL (ref 0.2–1.2)
Total Protein: 7.1 g/dL (ref 6.0–8.3)

## 2018-06-06 LAB — BASIC METABOLIC PANEL
BUN: 16 mg/dL (ref 6–23)
CALCIUM: 9.9 mg/dL (ref 8.4–10.5)
CO2: 31 mEq/L (ref 19–32)
Chloride: 104 mEq/L (ref 96–112)
Creatinine, Ser: 1.05 mg/dL (ref 0.40–1.50)
GFR: 80.77 mL/min (ref >60.00)
Glucose, Bld: 85 mg/dL (ref 70–99)
Potassium: 4 mEq/L (ref 3.5–5.1)
SODIUM: 141 meq/L (ref 135–145)

## 2018-06-06 NOTE — Telephone Encounter (Signed)
Pt was seen at our office today (06/05/2018) by Dr Tawanna Cooler.

## 2018-06-06 NOTE — Progress Notes (Signed)
Jacob Fletcher  is a 47 year old married male nonsmoker......... 53-year-old daughter...Marland KitchenMarland KitchenMarland Kitchen Who comes in today for evaluation of abnormal labs  He had some screening lab work at the office and was noted that his HDL cholesterol was 37 LDL was 120. SGOT was 48 SGPT was 93.  He's not been sick in any way. He said no fever chills nausea vomiting diarrhea etc. Etc. Healing drinks an occasional drink of alcohol maybe once or twice a month. He's had no history of liver disease foreign travel etc. Negative family history of liver disease.  He was last here couple years ago. That time his blood pressure was borderline. He's been monitoring his blood pressure at home episodically. It tends to run high at home  After he was here last he went to [facility]. He had V  reversed greater then impregnated his wife they have a 60-year-old daughter.  Family history pertinent both his mother and father have hypertension.  BP (!) 140/100 (BP Location: Left Arm, Patient Position: Sitting, Cuff Size: Large)   Pulse 83   Temp 98.1 F (36.7 C) (Oral)   Ht 5\' 5"  (1.651 m)   Wt 190 lb 4.8 oz (86.3 kg)   BMI 31.67 kg/m  Well-developed well-nourished male no acute distress vital signs stable he is afebrile BP right arm sitting position 162/110 pulse 70 and regular  Abdominal exam was negative  #1 hypertension......... BP check twice a day follow-up in 2 weeks....... No salt diet..... Walk 30 minutes daily  #2 abnormal LFTs.......Marland Kitchen Recheck LFTs...Marland KitchenMarland KitchenMarland Kitchen Screen for hepatitis

## 2018-06-06 NOTE — Patient Instructions (Signed)
Check your blood pressure in the morning and in the evening  Salt free diet  Walk 30 minutes daily  Return in September 23 for follow-up with a record of all your blood pressure readings and the device  Labs today............. I will call you

## 2018-06-07 LAB — HEPATITIS C ANTIBODY
HEP C AB: NONREACTIVE
SIGNAL TO CUT-OFF: 0.03 (ref <1.00)

## 2018-06-07 LAB — HEPATITIS B SURFACE ANTIBODY,QUALITATIVE: HEP B S AB: NONREACTIVE

## 2018-06-07 LAB — IRON AND TIBC
Iron Saturation: 43 % (ref 15–55)
Iron: 124 ug/dL (ref 38–169)
TIBC: 287 ug/dL (ref 250–450)
UIBC: 163 ug/dL (ref 111–343)

## 2018-06-11 ENCOUNTER — Other Ambulatory Visit: Payer: Self-pay | Admitting: Family Medicine

## 2018-06-11 DIAGNOSIS — R7989 Other specified abnormal findings of blood chemistry: Secondary | ICD-10-CM

## 2018-06-11 DIAGNOSIS — R945 Abnormal results of liver function studies: Secondary | ICD-10-CM

## 2018-06-12 ENCOUNTER — Ambulatory Visit: Payer: 59 | Admitting: Family Medicine

## 2018-06-25 ENCOUNTER — Encounter: Payer: Self-pay | Admitting: Family Medicine

## 2018-06-25 ENCOUNTER — Ambulatory Visit (INDEPENDENT_AMBULATORY_CARE_PROVIDER_SITE_OTHER): Payer: 59 | Admitting: Family Medicine

## 2018-06-25 VITALS — BP 142/98 | HR 79 | Temp 98.4°F | Wt 184.5 lb

## 2018-06-25 DIAGNOSIS — I10 Essential (primary) hypertension: Secondary | ICD-10-CM | POA: Diagnosis not present

## 2018-06-25 DIAGNOSIS — M109 Gout, unspecified: Secondary | ICD-10-CM | POA: Diagnosis not present

## 2018-06-25 LAB — URIC ACID: Uric Acid, Serum: 7.2 mg/dL (ref 4.0–7.8)

## 2018-06-25 MED ORDER — LISINOPRIL-HYDROCHLOROTHIAZIDE 10-12.5 MG PO TABS: 1.0000 | ORAL_TABLET | Freq: Every day | ORAL | 3 refills | Status: DC

## 2018-06-25 NOTE — Patient Instructions (Signed)
Zestoretic 10-12 0.5.......... One every morning for high blood pressure......Marland Kitchen. Monitor your blood pressure daily......Marland Kitchen. Return in 3 weeks for follow-up  We will check a uric acid level today and call you the report. In the meantime take 400 mg of Motrin twice daily until the pain goes away

## 2018-06-25 NOTE — Progress Notes (Signed)
Jacob Fletcher  is a 47 year old married male nonsmoker comes back today for follow-up of elevated blood pressure and a new problem of gout  [place] he had an episode of gout. He was treated with colchicine and the symptoms resolved. He's not had any trouble in the past 10 years. Couple weeks ago flared up he took some OTC Motrin and is 90% resolved. However he's never had a uric acid level  He's been monitoring his blood pressure at home. His BPs are all elevated   BP (!) 142/98 (BP Location: Right Arm, Patient Position: Sitting, Cuff Size: Large)   Pulse 79   Temp 98.4 F (36.9 C) (Oral)   Wt 184 lb 8 oz (83.7 kg)   SpO2 96%   BMI 30.70 kg/m  BP elevated is above right arm sitting position 144/100. His cuff also shows an elevated BP  #1 hypertension............... Begin Zestoretic 10-12 0.5.......... BP check daily....... Follow-up in 4 weeks  #2 history of gout,,,,,,,,,,, check uric acid level .

## 2018-07-16 ENCOUNTER — Encounter: Payer: Self-pay | Admitting: Family Medicine

## 2018-07-16 ENCOUNTER — Ambulatory Visit (INDEPENDENT_AMBULATORY_CARE_PROVIDER_SITE_OTHER): Payer: 59 | Admitting: Family Medicine

## 2018-07-16 VITALS — BP 110/72 | HR 75 | Temp 98.0°F | Wt 186.0 lb

## 2018-07-16 DIAGNOSIS — I10 Essential (primary) hypertension: Secondary | ICD-10-CM

## 2018-07-16 LAB — BASIC METABOLIC PANEL
BUN: 15 mg/dL (ref 6–23)
CHLORIDE: 103 meq/L (ref 96–112)
CO2: 33 meq/L — AB (ref 19–32)
Calcium: 9.6 mg/dL (ref 8.4–10.5)
Creatinine, Ser: 0.97 mg/dL (ref 0.40–1.50)
GFR: 88.47 mL/min (ref >60.00)
GLUCOSE: 101 mg/dL — AB (ref 70–99)
POTASSIUM: 4 meq/L (ref 3.5–5.1)
SODIUM: 140 meq/L (ref 135–145)

## 2018-07-16 LAB — HEPATIC FUNCTION PANEL
ALBUMIN: 4.3 g/dL (ref 3.5–5.2)
ALT: 87 U/L — ABNORMAL HIGH (ref 0–53)
AST: 38 U/L — AB (ref 0–37)
Alkaline Phosphatase: 81 U/L (ref 39–117)
Bilirubin, Direct: 0.1 mg/dL (ref 0.0–0.3)
TOTAL PROTEIN: 6.8 g/dL (ref 6.0–8.3)
Total Bilirubin: 0.6 mg/dL (ref 0.2–1.2)

## 2018-07-16 NOTE — Progress Notes (Signed)
Jacob Fletcher is a 47 year old married male non-smoker comes in today for follow-up of hypertension  On the Zestoretic 10-12 0.5 his blood pressures are normal.  No side effect from medication  .vs BP 110/72 (BP Location: Right Arm, Patient Position: Sitting, Cuff Size: Large)   Pulse 75   Temp 98 F (36.7 C) (Oral)   Wt 186 lb (84.4 kg)   SpO2 95%   BMI 30.95 kg/m  Well-developed well-nourished male no acute distress vital signs stable he is afebrile BP today 110/72  1.  Hypertension at goal........ continue Zestoretic..... Follow-up with new physician in [facility]

## 2018-07-16 NOTE — Patient Instructions (Signed)
Labs today....... I will call if there is anything abnormal  Continue Zestoretic........Marland Kitchen 1 tablet daily in the morning  Establish in January or February at [facility] with 1 of the new physicians there

## 2018-07-19 ENCOUNTER — Encounter: Payer: Self-pay | Admitting: Internal Medicine

## 2018-07-19 ENCOUNTER — Other Ambulatory Visit (INDEPENDENT_AMBULATORY_CARE_PROVIDER_SITE_OTHER): Payer: 59

## 2018-07-19 ENCOUNTER — Ambulatory Visit (INDEPENDENT_AMBULATORY_CARE_PROVIDER_SITE_OTHER): Payer: 59 | Admitting: Internal Medicine

## 2018-07-19 VITALS — BP 122/82 | HR 84 | Ht 65.0 in | Wt 186.5 lb

## 2018-07-19 DIAGNOSIS — R7989 Other specified abnormal findings of blood chemistry: Secondary | ICD-10-CM

## 2018-07-19 DIAGNOSIS — R945 Abnormal results of liver function studies: Secondary | ICD-10-CM

## 2018-07-19 LAB — PROTIME-INR
INR: 1 ratio (ref 0.8–1.0)
Prothrombin Time: 11.9 s (ref 9.6–13.1)

## 2018-07-19 LAB — FERRITIN: FERRITIN: 183.5 ng/mL (ref 22.0–322.0)

## 2018-07-19 LAB — IBC PANEL
IRON: 134 ug/dL (ref 42–165)
SATURATION RATIOS: 35.7 % (ref 20.0–50.0)
TRANSFERRIN: 268 mg/dL (ref 212.0–360.0)

## 2018-07-19 LAB — IRON: IRON: 134 ug/dL (ref 42–165)

## 2018-07-19 NOTE — Progress Notes (Signed)
HISTORY OF PRESENT ILLNESS:  Jacob Fletcher is a 47 y.o. male with history of hypertension and prior thrombosed hemorrhoid who is referred today by his primary provider Dr. Tawanna Cooler regarding elevated hepatic transaminases.  Review of the electronic record reveals that the patient has had elevated hepatic transaminases dating back at least as early as January 11, 2011.  At that time his AST was 50 and ALT 125.  Normal alkaline phosphatase, bilirubin, protein, and albumin.  Repeat liver tests February 2015 were normal.  No repeat liver tests documented until June 06, 2018 as part of a general wellness exam.  The ALT was elevated at 77.  The remainder of the liver tests were normal.  Repeat blood work July 16, 2018 revealed AST 38, ALT 87, alkaline phosphatase 81, total bilirubin 0.6.  Normal protein and albumin.  Normal CBC including MCV and platelets.  The patient denies a personal or family history of liver disease.  No history of transfusion or tattoos.  He consumes alcohol very infrequently, possibly 3 or 4 alcoholic beverages per month.  He reports 15 to 20 pound weight gain over the past 5 years.  He denies significant use of nonprescription medications that will take occasional NSAIDs for arthritis.  He has a history of gout.  No herbs or other over-the-counter entities.  His GI review of systems is remarkable for occasional indigestion and heartburn without dysphagia or abdominal pain.  Review of outside x-ray shows abdominal ultrasound performed January 13, 2011 revealed solitary gallstone and changes in the liver compatible with hepatic steatosis.  He was subsequently status post cholecystectomy with IOC questioning smaller bubbles versus small filling defect.  REVIEW OF SYSTEMS:  All non-GI ROS negative unless otherwise stated in the HPI except for sinus and allergy, back pain, cough, nosebleeds, sleeping problems  Past Medical History:  Diagnosis Date  . Allergy   . Asthma   . Hypertension      Past Surgical History:  Procedure Laterality Date  . CHOLECYSTECTOMY    . ESOPHAGEAL DILATION    . VASECTOMY    . VASECTOMY REVERSAL      Social History Jacob Fletcher  reports that he has quit smoking. His smoking use included cigarettes. He has never used smokeless tobacco. He reports that he drinks alcohol. He reports that he does not use drugs.  family history includes Asthma in his paternal grandmother; Cancer in his sister; Diabetes in his mother; Hypertension in his father and mother; Prostate cancer in his maternal uncle; Thyroid disease in his mother.  No Known Allergies     PHYSICAL EXAMINATION: Vital signs: BP 122/82   Pulse 84   Ht 5\' 5"  (1.651 m)   Wt 186 lb 8 oz (84.6 kg)   BMI 31.04 kg/m   Constitutional: Does not, generally well-appearing, no acute distress Psychiatric: alert and oriented x3, cooperative Eyes: extraocular movements intact, anicteric, conjunctiva pink Mouth: oral pharynx moist, no lesions Neck: supple no lymphadenopathy Cardiovascular: heart regular rate and rhythm, no murmur Lungs: clear to auscultation bilaterally Abdomen: soft, nontender, nondistended, no obvious ascites, no peritoneal signs, normal bowel sounds, no organomegaly Rectal: Omitted Extremities: no clubbing's, cyanosis, or edema lower extremity edema bilaterally Skin: no lesions on visible extremities Neuro: No focal deficits.  Cranial nerves intact.  No asterixis.   ASSESSMENT:  1.  Elevated hepatic transaminases.  No clinical evidence for liver disease or hepatic synthetic dysfunction.  Elevated liver tests remotely with subsequent resolution as documented above.  Remote ultrasound suggesting  steatosis.  Suspect fatty liver disease.  Rule out chronic viral hepatitis.  Rule out inherited or metabolic disorders. 2.  Obesity.  BMI 31   PLAN:  1.  Chronic hepatitis serologies to be performed today 2.  Multiple tests evaluating for non-viral causes of chronic liver  test abnormalities to be performed today 3.  Abdominal ultrasound to reevaluate the liver 4.  PT/INR today 5.  Office follow-up in 1 month to review the above results and provide clinical impression and associated plans.  A copy of this dictation has been sent to Dr. Tawanna Cooler

## 2018-07-19 NOTE — Patient Instructions (Signed)
Your provider has requested that you go to the basement level for lab work before leaving today. Press "B" on the elevator. The lab is located at the first door on the left as you exit the elevator.  You have been scheduled for an abdominal ultrasound at [facility] (1st floor of hospital) on 07/25/2018 at 9:00am. Please arrive 15 minutes prior to your appointment for registration. Make certain not to have anything to eat or drink after midnight prior to your appointment. Should you need to reschedule your appointment, please contact radiology at [phone]. This test typically takes about 30 minutes to perform.   Please follow up on 08/17/2018 at 2:15pm

## 2018-07-23 LAB — HEPATITIS B SURFACE ANTIGEN: Hepatitis B Surface Ag: NONREACTIVE

## 2018-07-23 LAB — MITOCHONDRIAL ANTIBODIES: Mitochondrial M2 Ab, IgG: <=20 U

## 2018-07-23 LAB — HEPATITIS C ANTIBODY
HEP C AB: NONREACTIVE
SIGNAL TO CUT-OFF: 0.04 (ref <1.00)

## 2018-07-23 LAB — TISSUE TRANSGLUTAMINASE, IGA: (TTG) AB, IGA: 1 U/mL

## 2018-07-23 LAB — CERULOPLASMIN: Ceruloplasmin: 30 mg/dL (ref 18–36)

## 2018-07-23 LAB — ALPHA-1-ANTITRYPSIN: A-1 Antitrypsin, Ser: 139 mg/dL (ref 83–199)

## 2018-07-23 LAB — ANA: Anti Nuclear Antibody(ANA): NEGATIVE

## 2018-07-23 LAB — ANTI-SMOOTH MUSCLE ANTIBODY, IGG

## 2018-07-23 LAB — HEPATITIS B SURFACE ANTIBODY,QUALITATIVE: Hep B S Ab: NONREACTIVE

## 2018-07-25 ENCOUNTER — Ambulatory Visit (HOSPITAL_COMMUNITY)
Admission: RE | Admit: 2018-07-25 | Discharge: 2018-07-25 | Disposition: A | Discharge status: Home / Self Care | Payer: 59 | Source: Ambulatory Visit | Attending: Internal Medicine | Admitting: Internal Medicine

## 2018-07-25 DIAGNOSIS — R7989 Other specified abnormal findings of blood chemistry: Secondary | ICD-10-CM | POA: Diagnosis not present

## 2018-07-25 DIAGNOSIS — Z9049 Acquired absence of other specified parts of digestive tract: Secondary | ICD-10-CM | POA: Insufficient documentation

## 2018-07-25 DIAGNOSIS — N281 Cyst of kidney, acquired: Secondary | ICD-10-CM | POA: Diagnosis not present

## 2018-07-25 DIAGNOSIS — R945 Abnormal results of liver function studies: Secondary | ICD-10-CM

## 2018-07-25 DIAGNOSIS — K760 Fatty (change of) liver, not elsewhere classified: Secondary | ICD-10-CM | POA: Insufficient documentation

## 2018-08-17 ENCOUNTER — Ambulatory Visit: Payer: 59 | Admitting: Internal Medicine

## 2019-06-27 ENCOUNTER — Other Ambulatory Visit: Payer: Self-pay | Admitting: Family Medicine

## 2019-06-27 ENCOUNTER — Telehealth: Payer: Self-pay

## 2019-06-27 MED ORDER — LISINOPRIL-HYDROCHLOROTHIAZIDE 10-12.5 MG PO TABS: 1.0000 | ORAL_TABLET | Freq: Every day | ORAL | 0 refills | Status: DC

## 2019-06-27 NOTE — Telephone Encounter (Signed)
Copied from [place] [id number]. Topic: General - Inquiry >> Jun 27, 2019 10:25 AM Mathis Bud wrote: Reason for CRM: Patient needs a refill of lisinopril-hydrochlorothiazide (PRINZIDE,ZESTORETIC) 10-12.5 MG tablet  Patient was a patient of todd,md.  Patient set up a new patient appt with Koberlien,MD for oct 21. Patient is concerned about not getting medication refilled due to the medication being blood pressure.  Patient states that he has seen Fry,MD also if Sarajane Jews could refill medication until he is seen by Koberlien.  PHARMACY: [facility] [place]  [id number]  Patient call back [phone]

## 2019-06-27 NOTE — Telephone Encounter (Signed)
I refilled

## 2019-06-27 NOTE — Telephone Encounter (Signed)
Noted  

## 2019-07-08 LAB — LIPID PANEL
Cholesterol: 177 (ref 0–200)
HDL: 35 (ref 35–70)
LDL Cholesterol: 119
Triglycerides: 128 (ref 40–160)

## 2019-07-08 LAB — BASIC METABOLIC PANEL: Glucose: 83

## 2019-07-24 ENCOUNTER — Encounter: Payer: Self-pay | Admitting: Family Medicine

## 2019-07-24 ENCOUNTER — Other Ambulatory Visit: Payer: Self-pay

## 2019-07-24 ENCOUNTER — Ambulatory Visit (INDEPENDENT_AMBULATORY_CARE_PROVIDER_SITE_OTHER): Payer: 59 | Admitting: Family Medicine

## 2019-07-24 VITALS — BP 100/80 | HR 81 | Temp 98.0°F | Ht 65.0 in | Wt 182.3 lb

## 2019-07-24 DIAGNOSIS — I10 Essential (primary) hypertension: Secondary | ICD-10-CM

## 2019-07-24 DIAGNOSIS — R748 Abnormal levels of other serum enzymes: Secondary | ICD-10-CM | POA: Diagnosis not present

## 2019-07-24 LAB — PR WAIST BELT: Other: 39

## 2019-07-24 LAB — DAILY WEIGHTS: Other: 179

## 2019-07-24 NOTE — Progress Notes (Signed)
Jacob Fletcher DOB: November 29, 1970 Encounter date: 07/24/2019  This isa 48 y.o. male who presents to establish care. Chief Complaint  Patient presents with  . Establish Care   Last visit was with Dr. Sherren Mocha 07/2018; here to establish care today.  History of present illness: Works for [employer] and they have health program. Did get bloodwork through them. Good cholesterol was a little low, but LDL was in acceptable range (states improved from previous)  Stiff a lot - notes in left foot when he wakes in the morning. Loosens through day and feels better.   Has 47 year old girl that keeps him active.   HTN: on lisinopril-hctz 10-12.5mg . doesn't check blood pressures regularly at home.   Asthma: not really issue for him anymore. Years ago had maint/emergency inhaler. Hasn't used anything in 3 years. Was worse when child. Also not outside and running which seemed to be his trigger in past. Has been able to slow down breathing and does well with this.   Allergy: worse when child. Used to get allergy shots. But seemed to improve with age.Still some itchy/dry eyes and throat sx during season, but takes claritin D when needed which helps.   Gout: In [place] was dx with gout. Then 2 years ago had similar sx in big toe. Talked with Dr. Sherren Mocha; not convinced it was gout. Uric acid was checked.  Never goes to dentist.     Past Medical History:  Diagnosis Date  . Allergy   . Asthma   . Hypertension    Past Surgical History:  Procedure Laterality Date  . CHOLECYSTECTOMY    . ESOPHAGEAL DILATION    . VASECTOMY    . VASECTOMY REVERSAL     No Known Allergies Current Meds  Medication Sig  . lisinopril-hydrochlorothiazide (ZESTORETIC) 10-12.5 MG tablet Take 1 tablet by mouth daily.  . Loratadine-Pseudoephedrine (CLARITIN-D 12 HOUR PO) Take by mouth as needed.   Social History   Tobacco Use  . Smoking status: Former Smoker    Types: Cigarettes  . Smokeless tobacco: Never Used  Substance  Use Topics  . Alcohol use: Yes    Alcohol/week: 0.0 standard drinks    Comment: every 3 weeks   Family History  Problem Relation Age of Onset  . Asthma Paternal Grandmother   . Diabetes Mother   . Thyroid disease Mother   . Hypertension Mother   . Cancer Sister        thyroid  . Hypertension Father   . Prostate cancer Maternal Uncle   . Colon cancer Neg Hx      Review of Systems  Constitutional: Negative for chills, fatigue and fever.  Respiratory: Negative for cough, chest tightness, shortness of breath and wheezing.   Cardiovascular: Negative for chest pain, palpitations and leg swelling.    Objective:  BP 100/80 (BP Location: Left Arm, Patient Position: Sitting, Cuff Size: Large)   Pulse 81   Temp 98 F (36.7 C) (Temporal)   Ht 5\' 5"  (1.651 m)   Wt 182 lb 4.8 oz (82.7 kg)   SpO2 98%   BMI 30.34 kg/m   Weight: 182 lb 4.8 oz (82.7 kg)   BP Readings from Last 3 Encounters:  07/24/19 100/80  07/19/18 122/82  07/16/18 110/72   Wt Readings from Last 3 Encounters:  07/24/19 182 lb 4.8 oz (82.7 kg)  07/19/18 186 lb 8 oz (84.6 kg)  07/16/18 186 lb (84.4 kg)    Physical Exam Constitutional:  General: He is not in acute distress.    Appearance: He is well-developed.  Cardiovascular:     Rate and Rhythm: Normal rate and regular rhythm.     Heart sounds: Normal heart sounds. No murmur. No friction rub.  Pulmonary:     Effort: Pulmonary effort is normal. No respiratory distress.     Breath sounds: Normal breath sounds. No wheezing or rales.  Musculoskeletal:     Right lower leg: No edema.     Left lower leg: No edema.  Neurological:     Mental Status: He is alert and oriented to person, place, and time.  Psychiatric:        Behavior: Behavior normal.     Assessment/Plan:  1. Essential hypertension Stable; continue current medication - CBC with Differential/Platelet; Future - Comprehensive metabolic panel; Future  2. Elevated liver enzymes -  Comprehensive metabolic panel; Future  Return in about 6 months (around 01/22/2020) for physical exam.  Theodis Shove, MD  Declines flu shot today

## 2019-07-24 NOTE — Patient Instructions (Addendum)
Please check and see if you had additional testing through employer:  Bmp or cmp (showing your electrolytes like potassium (K) and kidney function (Bun/creatinine)

## 2019-08-26 ENCOUNTER — Other Ambulatory Visit: Payer: Self-pay | Admitting: Family Medicine

## 2019-11-08 ENCOUNTER — Ambulatory Visit (INDEPENDENT_AMBULATORY_CARE_PROVIDER_SITE_OTHER): Payer: 59

## 2019-11-08 ENCOUNTER — Encounter: Payer: Self-pay | Admitting: Podiatry

## 2019-11-08 ENCOUNTER — Ambulatory Visit (INDEPENDENT_AMBULATORY_CARE_PROVIDER_SITE_OTHER): Payer: 59 | Admitting: Podiatry

## 2019-11-08 ENCOUNTER — Other Ambulatory Visit: Payer: Self-pay

## 2019-11-08 VITALS — BP 126/93 | HR 91 | Temp 97.2°F | Resp 16

## 2019-11-08 DIAGNOSIS — M722 Plantar fascial fibromatosis: Secondary | ICD-10-CM

## 2019-11-08 DIAGNOSIS — G629 Polyneuropathy, unspecified: Secondary | ICD-10-CM

## 2019-11-08 NOTE — Progress Notes (Signed)
   Subjective:    Patient ID: Jacob Fletcher, male    DOB: 1971-02-21, 49 y.o.   MRN: [id number]  HPI    Review of Systems  All other systems reviewed and are negative.      Objective:   Physical Exam        Assessment & Plan:

## 2019-11-13 NOTE — Progress Notes (Signed)
Subjective:   Patient ID: Jacob Fletcher, male   DOB: 49 y.o.   MRN: [id number]   HPI Patient presents stating that he has had on and off problems with his foot left over right and he used to run and has had trouble with running.  Patient states he feels like he gets some burning pain and he gets some pain in his heel and into his arch left over right.  Patient does not smoke likes to be active   Review of Systems  All other systems reviewed and are negative.       Objective:  Physical Exam Vitals and nursing note reviewed.  Constitutional:      Appearance: He is well-developed.  Pulmonary:     Effort: Pulmonary effort is normal.  Musculoskeletal:        General: Normal range of motion.  Skin:    General: Skin is warm.  Neurological:     Mental Status: He is alert.     Neurovascular status intact muscle strength found to be adequate range of motion within normal limits.  I did note that there is discomfort of a mild nature in the left of the right arch mildly into the heel and I did check and I did not note any indications of neuroma-like symptoms or palpable mass formation.  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  We will try good support therapy and see how he responds  X-rays indicate moderate depression of the arch bilateral with no indications of spur formation or arthritic condition

## 2019-11-25 ENCOUNTER — Ambulatory Visit: Payer: 59 | Attending: Internal Medicine

## 2019-11-25 DIAGNOSIS — Z20822 Contact with and (suspected) exposure to covid-19: Secondary | ICD-10-CM

## 2019-11-26 LAB — NOVEL CORONAVIRUS, NAA: SARS-CoV-2, NAA: NOT DETECTED

## 2020-01-25 ENCOUNTER — Other Ambulatory Visit: Payer: Self-pay | Admitting: Family Medicine

## 2020-02-22 ENCOUNTER — Other Ambulatory Visit: Payer: Self-pay | Admitting: Family Medicine

## 2020-03-21 IMAGING — US US ABDOMEN COMPLETE
1 series · 14 of 25 positions shown · non-contrast
Comparison: None.

CLINICAL DATA: Elevated liver function tests.

EXAM:
ABDOMEN ULTRASOUND COMPLETE

[Series 1: us abdomen complete · 14 of 85 slices shown]
[im 1/85]
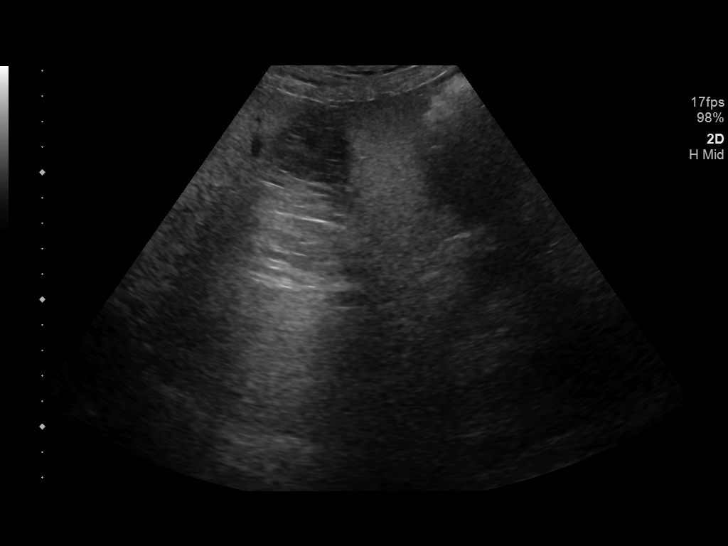
[im 8/85]
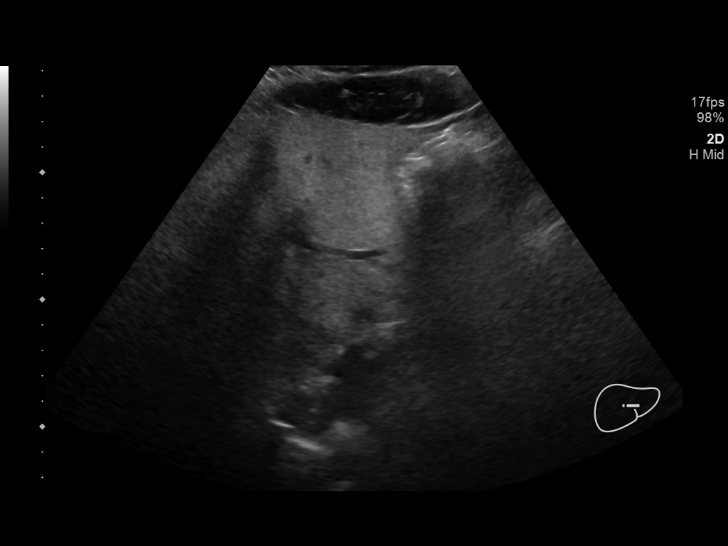
[im 15/85]
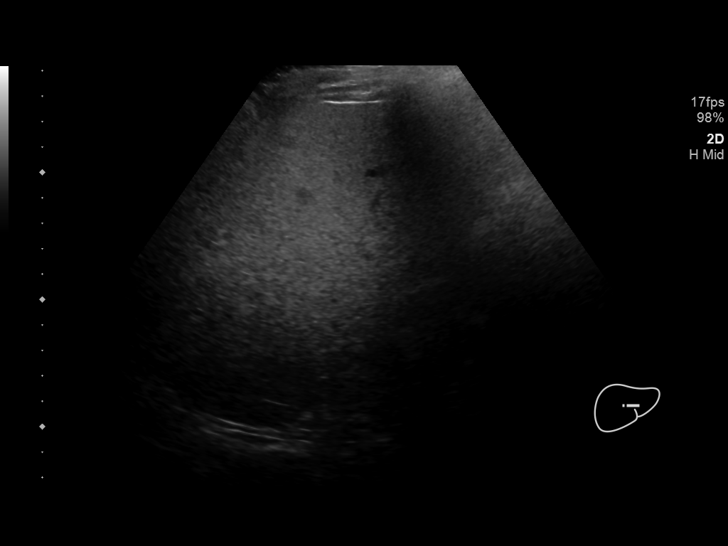
[im 22/85]
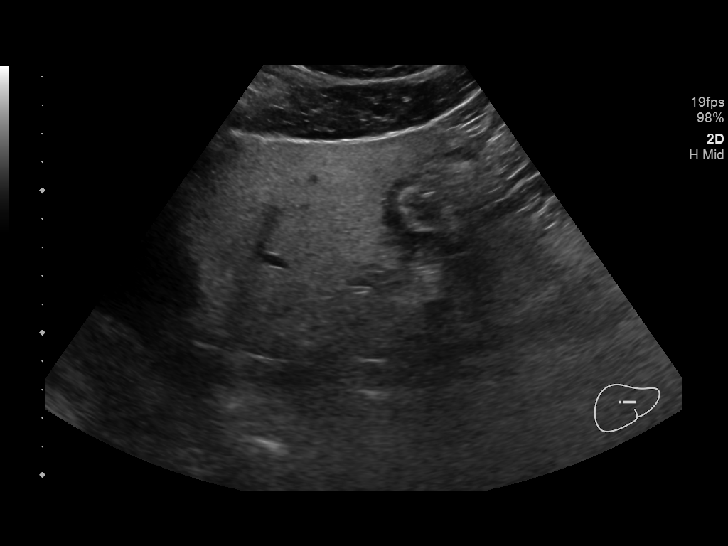
[im 29/85]
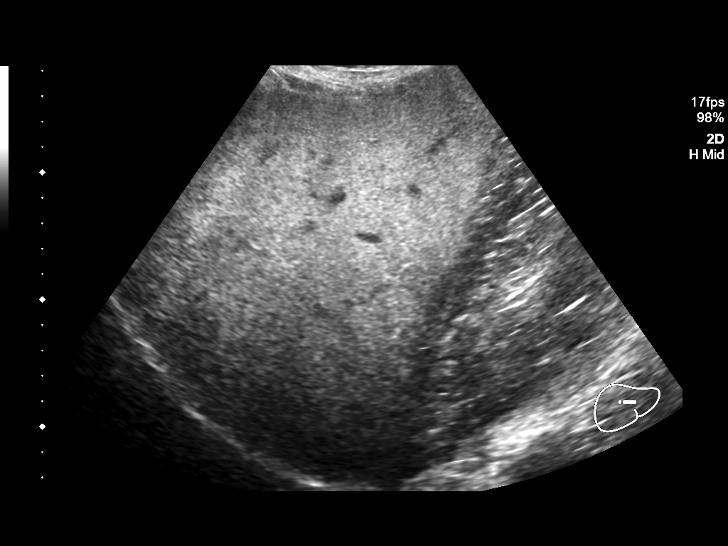
[im 32/85]
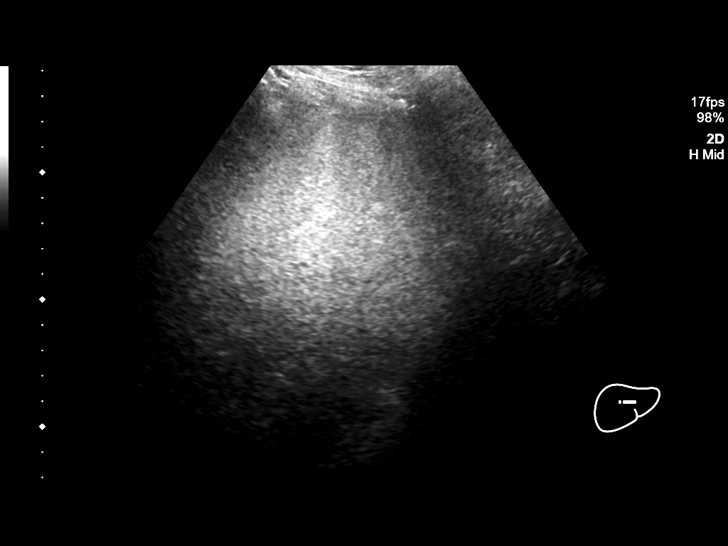
[im 39/85]
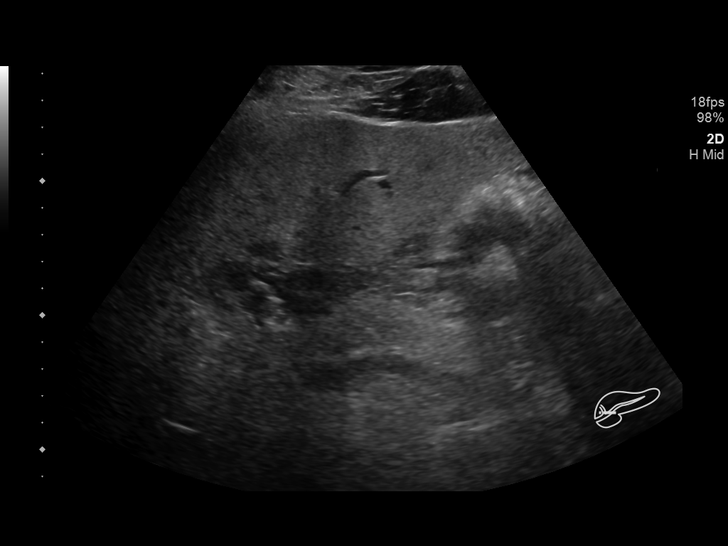
[im 46/85]
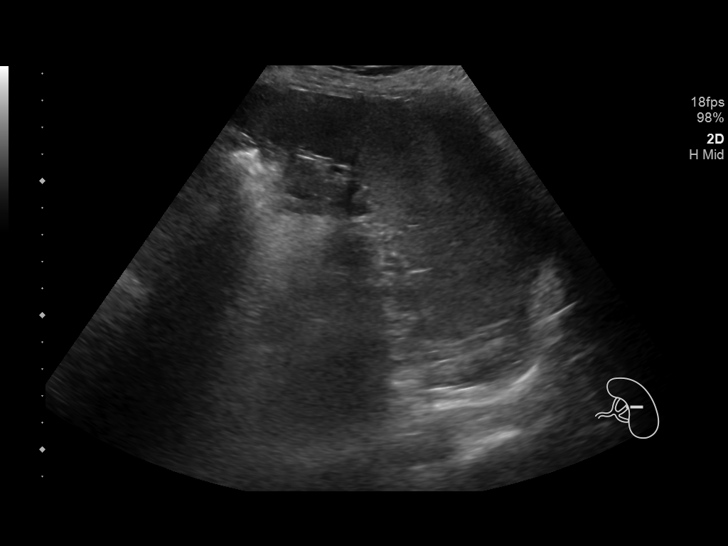
[im 53/85]
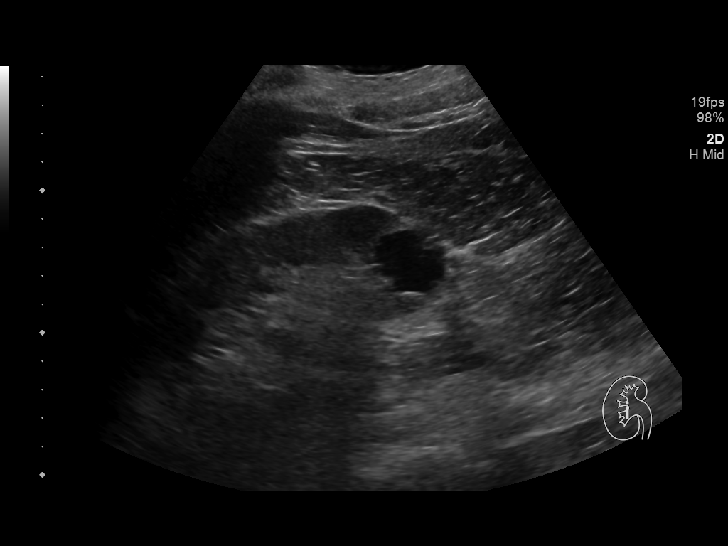
[im 57/85]
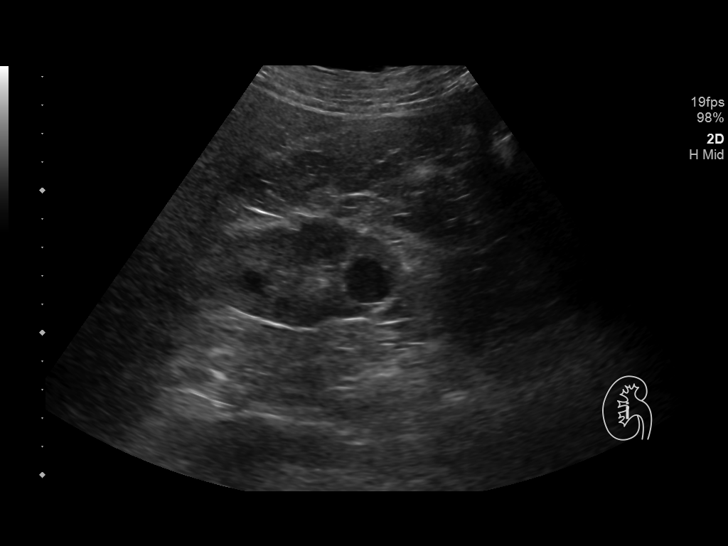
[im 64/85]
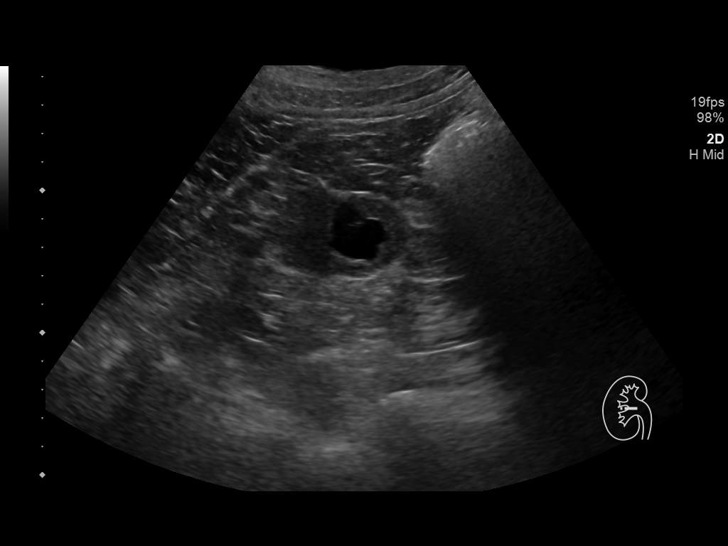
[im 71/85]
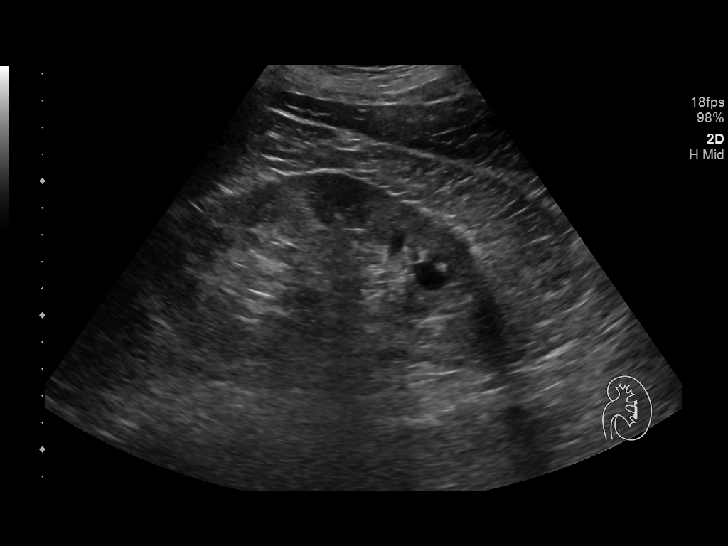
[im 78/85]
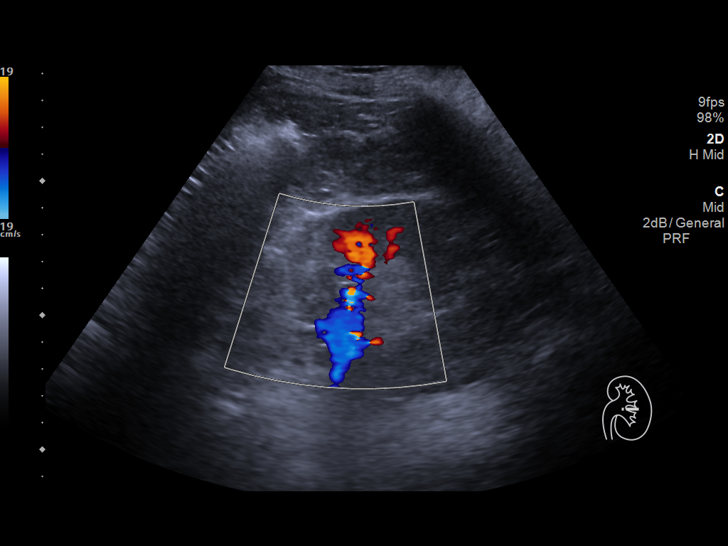
[im 85/85]
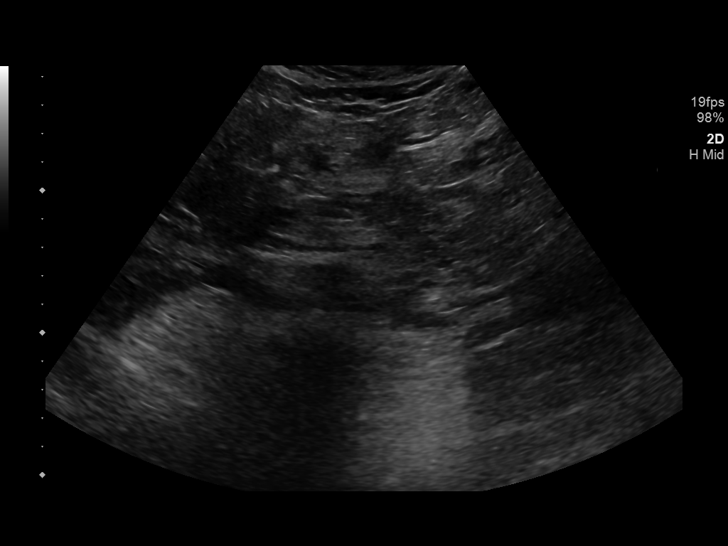

[14 of 25 positions shown; findings below may reference images not displayed]

FINDINGS: Gallbladder: Removed.

Common bile duct: Diameter: 0.7 cm

Liver: No focal lesion. Diffusely increased echogenicity. Portal
vein is patent on color Doppler imaging with normal direction of
blood flow towards the liver.

IVC: No abnormality visualized.

Pancreas: Visualized portion unremarkable.

Spleen: Size and appearance within normal limits.

Right Kidney: Length: 12.1 cm. Echogenicity within normal limits.
Simple cyst measuring 2.4 and 1.7 cm in diameter noted. No solid
mass or hydronephrosis visualized.

Left Kidney: Length: 11.7 cm. Echogenicity within normal limits.
Simple cyst measuring 1.3 cm in diameter noted. No solid mass or
hydronephrosis visualized.

Abdominal aorta: No aneurysm visualized.

Other findings: None.
IMPRESSION: Fatty infiltration of the liver.

Status post cholecystectomy.

Two simple cysts right kidney.  Single simple cyst left kidney.

## 2020-03-23 ENCOUNTER — Other Ambulatory Visit: Payer: Self-pay | Admitting: Family Medicine

## 2020-04-27 ENCOUNTER — Other Ambulatory Visit: Payer: Self-pay | Admitting: Family Medicine

## 2020-05-24 ENCOUNTER — Other Ambulatory Visit: Payer: Self-pay | Admitting: Family Medicine

## 2020-06-16 ENCOUNTER — Other Ambulatory Visit: Payer: Self-pay

## 2020-06-16 ENCOUNTER — Ambulatory Visit (INDEPENDENT_AMBULATORY_CARE_PROVIDER_SITE_OTHER): Payer: 59 | Admitting: Family Medicine

## 2020-06-16 ENCOUNTER — Encounter: Payer: Self-pay | Admitting: Family Medicine

## 2020-06-16 VITALS — BP 116/80 | HR 79 | Temp 98.6°F | Ht 64.5 in | Wt 190.8 lb

## 2020-06-16 DIAGNOSIS — R002 Palpitations: Secondary | ICD-10-CM

## 2020-06-16 DIAGNOSIS — Z23 Encounter for immunization: Secondary | ICD-10-CM | POA: Diagnosis not present

## 2020-06-16 DIAGNOSIS — I10 Essential (primary) hypertension: Secondary | ICD-10-CM

## 2020-06-16 MED ORDER — LOSARTAN POTASSIUM 50 MG PO TABS: 50.0000 mg | ORAL_TABLET | Freq: Every day | ORAL | 0 refills | Status: DC

## 2020-06-16 NOTE — Addendum Note (Signed)
Addended by: Wilford Corner on: 06/16/2020 05:49 PM   Modules accepted: Orders

## 2020-06-16 NOTE — Progress Notes (Signed)
   Subjective:    Patient ID: Jacob Fletcher, male    DOB: 1971-10-03, 49 y.o.   MRN: [id number]  HPI Here for one week of difficult to describe sensations in his chest. He says it feels like "when you are at the top of a large ramp on a roller coaster and are just starting to go down the other side". No racing or skipping, no chest pain or SOB. No coughing. No heartburn. This comes and goes but it is present most of the time. No association with exercise or with eating. He has been checking his vital signs closely and his BP is steady in the range of 110-124/68-78. His pulse is always 60-74. He has been taking Lisinopril HCT for about 2 years. He has cut back on his caffeine use with no effect.   Review of Systems  Constitutional: Negative.   Respiratory: Negative.   Cardiovascular: Positive for palpitations. Negative for chest pain and leg swelling.  Gastrointestinal: Negative.        Objective:   Physical Exam Constitutional:      General: He is not in acute distress.    Appearance: Normal appearance.  Cardiovascular:     Rate and Rhythm: Normal rate and regular rhythm.     Pulses: Normal pulses.     Heart sounds: Normal heart sounds.  Pulmonary:     Effort: Pulmonary effort is normal.     Breath sounds: Normal breath sounds.  Neurological:     Mental Status: He is alert.           Assessment & Plan:  Palpitations, it seems likely that this is a side effect of Lisinopril, not unlike the cough that many people develop. We will check a BMET, CBC, and TSH today. Stop the Lisinopril HCT and start on Losartan 50 mg daily. Follow up with Dr. Hassan Rowan in 2-3 weeks.  Gershon Crane, MD

## 2020-06-17 LAB — BASIC METABOLIC PANEL
BUN: 15 mg/dL (ref 7–25)
CO2: 28 mmol/L (ref 20–32)
Calcium: 9.9 mg/dL (ref 8.6–10.3)
Chloride: 103 mmol/L (ref 98–110)
Creat: 1.1 mg/dL (ref 0.60–1.35)
Glucose, Bld: 92 mg/dL (ref 65–99)
Potassium: 3.8 mmol/L (ref 3.5–5.3)
Sodium: 140 mmol/L (ref 135–146)

## 2020-06-17 LAB — CBC WITH DIFFERENTIAL/PLATELET
Absolute Monocytes: 740 cells/uL (ref 200–950)
Basophils Absolute: 69 cells/uL (ref 0–200)
Basophils Relative: 0.8 %
Eosinophils Absolute: 817 cells/uL — ABNORMAL HIGH (ref 15–500)
Eosinophils Relative: 9.5 %
HCT: 46.8 % (ref 38.5–50.0)
Hemoglobin: 16.7 g/dL (ref 13.2–17.1)
Lymphs Abs: 3259 cells/uL (ref 850–3900)
MCH: 32.4 pg (ref 27.0–33.0)
MCHC: 35.7 g/dL (ref 32.0–36.0)
MCV: 90.7 fL (ref 80.0–100.0)
MPV: 10.5 fL (ref 7.5–12.5)
Monocytes Relative: 8.6 %
Neutro Abs: 3715 cells/uL (ref 1500–7800)
Neutrophils Relative %: 43.2 %
Platelets: 253 10*3/uL (ref 140–400)
RBC: 5.16 10*6/uL (ref 4.20–5.80)
RDW: 12.9 % (ref 11.0–15.0)
Total Lymphocyte: 37.9 %
WBC: 8.6 10*3/uL (ref 3.8–10.8)

## 2020-06-17 LAB — TSH: TSH: 0.7 mIU/L (ref 0.40–4.50)

## 2020-06-19 ENCOUNTER — Other Ambulatory Visit: Payer: Self-pay | Admitting: Family Medicine

## 2020-07-10 ENCOUNTER — Other Ambulatory Visit: Payer: Self-pay | Admitting: Family Medicine

## 2020-07-10 NOTE — Telephone Encounter (Signed)
Pt was to follow up with PCP in 2-3 weeks.   Rf rq for losartan has been denied.

## 2020-08-06 ENCOUNTER — Encounter: Payer: Self-pay | Admitting: Family Medicine

## 2020-08-06 ENCOUNTER — Telehealth (INDEPENDENT_AMBULATORY_CARE_PROVIDER_SITE_OTHER): Payer: 59 | Admitting: Family Medicine

## 2020-08-06 DIAGNOSIS — I10 Essential (primary) hypertension: Secondary | ICD-10-CM

## 2020-08-06 MED ORDER — LISINOPRIL-HYDROCHLOROTHIAZIDE 10-12.5 MG PO TABS
1.0000 | ORAL_TABLET | Freq: Every day | ORAL | 0 refills | Status: DC
Start: 2020-08-06 — End: 2020-10-16

## 2020-08-06 NOTE — Progress Notes (Signed)
Virtual Visit via Telephone Note  I connected with Jacob Fletcher on 08/06/20 at  3:40 PM EDT by telephone and verified that I am speaking with the correct person using two identifiers.   I discussed the limitations, risks, security and privacy concerns of performing an evaluation and management service by telephone and the availability of in person appointments. I also discussed with the patient that there may be a patient responsible charge related to this service. The patient expressed understanding and agreed to proceed.  Location patient: home, [place] Location provider: work or home office Participants present for the call: patient, provider Patient did not have a visit in the prior 7 days to address this/these issue(s).   History of Present Illness:  Acute telemedicine visit for Hypertension: -used to take lis-hctz - worked great for him -was seen for palpitations by Dr. Clent Ridges (pt thinks was stress) but reports was told to try losartan instead of the lis-hctz to see if that helped. Reports that change did not help, so he went back to the lisinopril-hctz a few weeks ago. Had a change in life situation/stress and palpitations resolved despite being on lis-hctz -he prefers to go back to the lis-hctz for his BP. Reports is monitoring his BP and it does great on this medication -denies recent palpitation or any CP, SOB or any other concerns   Observations/Objective: Patient sounds cheerful and well on the phone. I do not appreciate any SOB. Speech and thought processing are grossly intact. Patient reported vitals:  Assessment and Plan:  Hypertension, unspecified type  Discussed options for the treatment of his blood pressure.  He prefers to go back to his old medication, the lisinopril-hydrochlorothiazide 10-12.5 mg.  His palpitations have resolved, despite taking this medication the last few weeks when the losartan ran out.  He had recent lab work.  Sent refills for 90 days.  Advised to  follow-up with his PCP in 2 to 3 months.  Follow Up Instructions:  I did not refer this patient for an OV in the next 24 hours for this/these issue(s).  I discussed the assessment and treatment plan with the patient. The patient was provided an opportunity to ask questions and all were answered. The patient agreed with the plan and demonstrated an understanding of the instructions.   Spent about 15 minutes on this encounter.   Terressa Koyanagi, DO

## 2020-10-14 ENCOUNTER — Other Ambulatory Visit: Payer: Self-pay | Admitting: *Deleted

## 2020-10-16 MED ORDER — LISINOPRIL-HYDROCHLOROTHIAZIDE 10-12.5 MG PO TABS
1.0000 | ORAL_TABLET | Freq: Every day | ORAL | 0 refills | Status: DC
Start: 2020-10-16 — End: 2021-01-04

## 2020-10-16 NOTE — Telephone Encounter (Signed)
Is there more to this message? Sorry just seeing pharmacy and no note.

## 2020-10-16 NOTE — Telephone Encounter (Signed)
Patient informed of the message below.  Appt for CPE was scheduled on 3/7.

## 2020-10-16 NOTE — Telephone Encounter (Signed)
Overdue for office visit. Med refilled; please set up physical.

## 2020-10-29 ENCOUNTER — Other Ambulatory Visit: Payer: Self-pay | Admitting: Family Medicine

## 2020-12-07 ENCOUNTER — Other Ambulatory Visit: Payer: Self-pay

## 2020-12-07 ENCOUNTER — Encounter: Payer: Self-pay | Admitting: Family Medicine

## 2020-12-07 ENCOUNTER — Ambulatory Visit (INDEPENDENT_AMBULATORY_CARE_PROVIDER_SITE_OTHER): Payer: BC Managed Care – PPO | Admitting: Family Medicine

## 2020-12-07 VITALS — BP 100/72 | HR 72 | Temp 98.2°F | Ht 64.5 in | Wt 186.8 lb

## 2020-12-07 DIAGNOSIS — R5383 Other fatigue: Secondary | ICD-10-CM

## 2020-12-07 DIAGNOSIS — R0683 Snoring: Secondary | ICD-10-CM

## 2020-12-07 DIAGNOSIS — R739 Hyperglycemia, unspecified: Secondary | ICD-10-CM

## 2020-12-07 DIAGNOSIS — R040 Epistaxis: Secondary | ICD-10-CM

## 2020-12-07 DIAGNOSIS — Z0000 Encounter for general adult medical examination without abnormal findings: Secondary | ICD-10-CM | POA: Diagnosis not present

## 2020-12-07 DIAGNOSIS — I10 Essential (primary) hypertension: Secondary | ICD-10-CM

## 2020-12-07 DIAGNOSIS — G629 Polyneuropathy, unspecified: Secondary | ICD-10-CM

## 2020-12-07 DIAGNOSIS — Z13220 Encounter for screening for lipoid disorders: Secondary | ICD-10-CM

## 2020-12-07 DIAGNOSIS — E0590 Thyrotoxicosis, unspecified without thyrotoxic crisis or storm: Secondary | ICD-10-CM

## 2020-12-07 DIAGNOSIS — R748 Abnormal levels of other serum enzymes: Secondary | ICD-10-CM

## 2020-12-07 LAB — COMPREHENSIVE METABOLIC PANEL
ALT: 71 U/L — ABNORMAL HIGH (ref 0–53)
AST: 34 U/L (ref 0–37)
Albumin: 4.5 g/dL (ref 3.5–5.2)
Alkaline Phosphatase: 68 U/L (ref 39–117)
BUN: 13 mg/dL (ref 6–23)
CO2: 33 mEq/L — ABNORMAL HIGH (ref 19–32)
Calcium: 9.8 mg/dL (ref 8.4–10.5)
Chloride: 101 mEq/L (ref 96–112)
Creatinine, Ser: 1.03 mg/dL (ref 0.40–1.50)
GFR: 85.82 mL/min (ref >60.00)
Glucose, Bld: 87 mg/dL (ref 70–99)
Potassium: 4.1 mEq/L (ref 3.5–5.1)
Sodium: 140 mEq/L (ref 135–145)
Total Bilirubin: 0.7 mg/dL (ref 0.2–1.2)
Total Protein: 6.9 g/dL (ref 6.0–8.3)

## 2020-12-07 LAB — LIPID PANEL
Cholesterol: 169 mg/dL (ref 0–200)
HDL: 37.6 mg/dL — ABNORMAL LOW (ref >39.00)
LDL Cholesterol: 114 mg/dL — ABNORMAL HIGH (ref 0–99)
NonHDL: 131.31
Total CHOL/HDL Ratio: 4
Triglycerides: 89 mg/dL (ref 0.0–149.0)
VLDL: 17.8 mg/dL (ref 0.0–40.0)

## 2020-12-07 LAB — CBC WITH DIFFERENTIAL/PLATELET
Basophils Absolute: 0.1 10*3/uL (ref 0.0–0.1)
Basophils Relative: 0.9 % (ref 0.0–3.0)
Eosinophils Absolute: 0.3 10*3/uL (ref 0.0–0.7)
Eosinophils Relative: 5.5 % — ABNORMAL HIGH (ref 0.0–5.0)
HCT: 49 % (ref 39.0–52.0)
Hemoglobin: 16.8 g/dL (ref 13.0–17.0)
Lymphocytes Relative: 42.5 % (ref 12.0–46.0)
Lymphs Abs: 2.6 10*3/uL (ref 0.7–4.0)
MCHC: 34.3 g/dL (ref 30.0–36.0)
MCV: 91.3 fl (ref 78.0–100.0)
Monocytes Absolute: 0.5 10*3/uL (ref 0.1–1.0)
Monocytes Relative: 7.8 % (ref 3.0–12.0)
Neutro Abs: 2.7 10*3/uL (ref 1.4–7.7)
Neutrophils Relative %: 43.3 % (ref 43.0–77.0)
Platelets: 230 10*3/uL (ref 150.0–400.0)
RBC: 5.37 Mil/uL (ref 4.22–5.81)
RDW: 13.1 % (ref 11.5–15.5)
WBC: 6.1 10*3/uL (ref 4.0–10.5)

## 2020-12-07 LAB — VITAMIN B12: Vitamin B-12: 388 pg/mL (ref 211–911)

## 2020-12-07 LAB — VITAMIN D 25 HYDROXY (VIT D DEFICIENCY, FRACTURES): VITD: 19.14 ng/mL — ABNORMAL LOW (ref 30.00–100.00)

## 2020-12-07 LAB — HEMOGLOBIN A1C: Hgb A1c MFr Bld: 5.2 % (ref 4.6–6.5)

## 2020-12-07 LAB — T4, FREE: Free T4: 1.05 ng/dL (ref 0.60–1.60)

## 2020-12-07 LAB — T3, FREE: T3, Free: 4.4 pg/mL — ABNORMAL HIGH (ref 2.3–4.2)

## 2020-12-07 LAB — TSH: TSH: 1.01 u[IU]/mL (ref 0.35–4.50)

## 2020-12-07 MED ORDER — CLOTRIMAZOLE 1 % EX CREA: 1.0000 "application " | TOPICAL_CREAM | Freq: Two times a day (BID) | CUTANEOUS | 0 refills | Status: AC

## 2020-12-07 NOTE — Addendum Note (Signed)
Addended by: Leonette Nutting on: 12/07/2020 09:07 AM   Modules accepted: Orders

## 2020-12-07 NOTE — Patient Instructions (Signed)
Afrin - can be helpful for stopping nose bleeds.  Nasal saline gel/spray daily to help with moisturizing.

## 2020-12-07 NOTE — Progress Notes (Signed)
Jacob Fletcher DOB: 02-03-1971 Encounter date: 12/07/2020  This is a 50 y.o. male who presents for complete physical   History of present illness/Additional concerns: Last visit with me was 07/24/2019.  *gets a ton of bloody noses - has been going on for years. Usually in left side only. Comes out of nowhere - gushes out. Length of bleeding can be 30 seconds or 2 minutes; uses toilet paper shoved into nostril to stop bleeding. Anterior. Has these all year long. Takes claritin D all year long which helps with allergy symptoms.   *losing feeling in fingers/arms sometimes. Happens bilaterally. If he puts arms straight out for arm circles, then can't feel fingers after a minute and a half. 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 Sometimes laying in bed and whole left arm goes numb; shifts and then it gets better. No pain associated, just numb.   *tops of feet are super sensitive - light touch can be very painful. No numbness in feet. Not bottom of feet.   *rash on either side of groin - right side spots; left side more eliptycal in shape. No color changes, no swelling, no color changes.   HTN: on lisinopril-hctz 10-12.39m but switch to losartan 50 mg after noting some palpitations but this did not help so restarted lisinopril-hydrochlorothiazide combination. doesn't check blood pressures regularly at home. Feels he is doing well with this. Thinks that palpitations were work/stress related. Still under stress, but not noting palpitations. Doesn't check at home much.   Has had episodes of dizziness. Not related to changes in position or eating. States that it doesn't seem related to blood pressure. Has to hold on to something to stabilize self and feel less dizzy.    Past Medical History:  Diagnosis Date  . Allergy   . Asthma   . Hypertension    Past Surgical History:  Procedure Laterality Date  . CHOLECYSTECTOMY    . ESOPHAGEAL DILATION    . VASECTOMY    . VASECTOMY REVERSAL      No Known Allergies Current Meds  Medication Sig  . clotrimazole (CLOTRIMAZOLE AF) 1 % cream Apply 1 application topically 2 (two) times daily for 14 days.  .Marland Kitchenlisinopril-hydrochlorothiazide (ZESTORETIC) 10-12.5 MG tablet Take 1 tablet by mouth daily.  . Loratadine-Pseudoephedrine (CLARITIN-D 12 HOUR PO) Take by mouth as needed.   Social History   Tobacco Use  . Smoking status: Former Smoker    Types: Cigarettes  . Smokeless tobacco: Never Used  Substance Use Topics  . Alcohol use: Yes    Alcohol/week: 0.0 standard drinks    Comment: every 3 weeks   Family History  Problem Relation Age of Onset  . Asthma Paternal Grandmother   . Diabetes Mother   . Thyroid disease Mother   . Hypertension Mother   . Cancer Sister        thyroid  . Hypertension Father   . Prostate cancer Maternal Uncle   . Colon cancer Neg Hx      Review of Systems  Constitutional: Negative for activity change, appetite change, chills, fatigue, fever and unexpected weight change.  HENT: Positive for nosebleeds. Negative for congestion, ear pain, hearing loss, sinus pressure, sinus pain, sore throat and trouble swallowing.   Eyes: Negative for pain and visual disturbance.  Respiratory: Negative for cough, chest tightness, shortness of breath and wheezing.   Cardiovascular: Negative for chest pain, palpitations and leg swelling.  Gastrointestinal: Negative for abdominal distention, abdominal pain, blood in stool, constipation,  diarrhea, nausea and vomiting.  Genitourinary: Negative for decreased urine volume, difficulty urinating, dysuria, penile pain and testicular pain.  Musculoskeletal: Negative for arthralgias, back pain and joint swelling.  Skin: Positive for rash.  Neurological: Positive for dizziness and numbness. Negative for weakness and headaches.  Hematological: Negative for adenopathy. Does not bruise/bleed easily.  Psychiatric/Behavioral: Negative for agitation, sleep disturbance and suicidal  ideas. The patient is not nervous/anxious.     CBC:  Lab Results  Component Value Date   WBC 8.6 06/16/2020   HGB 16.7 06/16/2020   HCT 46.8 06/16/2020   MCH 32.4 06/16/2020   MCHC 35.7 06/16/2020   RDW 12.9 06/16/2020   PLT 253 06/16/2020   MPV 10.5 06/16/2020   CMP: Lab Results  Component Value Date   NA 140 06/16/2020   K 3.8 06/16/2020   CL 103 06/16/2020   CO2 28 06/16/2020   GLUCOSE 92 06/16/2020   BUN 15 06/16/2020   CREATININE 1.10 06/16/2020   GFRAA  02/08/2011    >60        The eGFR has been calculated using the MDRD equation. This calculation has not been validated in all clinical situations. eGFR's persistently <60 mL/min signify possible Chronic Kidney Disease.   CALCIUM 9.9 06/16/2020   PROT 6.8 07/16/2018   BILITOT 0.6 07/16/2018   ALKPHOS 81 07/16/2018   ALT 87 (H) 07/16/2018   AST 38 (H) 07/16/2018   LIPID: Lab Results  Component Value Date   CHOL 177 07/08/2019   TRIG 128 07/08/2019   HDL 35 07/08/2019   LDLCALC 119 07/08/2019    Objective:  BP 100/72 (BP Location: Left Arm, Patient Position: Sitting, Cuff Size: Large)   Pulse 72   Temp 98.2 F (36.8 C) (Oral)   Ht 5' 4.5" (1.638 m)   Wt 186 lb 12.8 oz (84.7 kg)   SpO2 96%   BMI 31.57 kg/m   Weight: 186 lb 12.8 oz (84.7 kg)   BP Readings from Last 3 Encounters:  12/07/20 100/72  06/16/20 116/80  11/08/19 (!) 126/93   Wt Readings from Last 3 Encounters:  12/07/20 186 lb 12.8 oz (84.7 kg)  06/16/20 190 lb 12.8 oz (86.5 kg)  07/24/19 182 lb 4.8 oz (82.7 kg)    Physical Exam Constitutional:      General: He is not in acute distress.    Appearance: He is well-developed and well-nourished.  HENT:     Head: Normocephalic and atraumatic.     Right Ear: External ear normal.     Left Ear: External ear normal.     Nose: Nose normal.     Mouth/Throat:     Mouth: Oropharynx is clear and moist.     Pharynx: No oropharyngeal exudate.  Eyes:     Conjunctiva/sclera: Conjunctivae  normal.     Pupils: Pupils are equal, round, and reactive to light.  Neck:     Thyroid: No thyromegaly.  Cardiovascular:     Rate and Rhythm: Normal rate and regular rhythm.     Pulses: Intact distal pulses.     Heart sounds: Normal heart sounds. No murmur heard. No friction rub. No gallop.   Pulmonary:     Effort: Pulmonary effort is normal. No respiratory distress.     Breath sounds: Normal breath sounds. No stridor. No wheezing or rales.  Abdominal:     General: Bowel sounds are normal.     Palpations: Abdomen is soft.  Musculoskeletal:        General:  Normal range of motion.     Cervical back: Neck supple.  Skin:    General: Skin is warm and dry.     Comments: Pink macules, circular bilat groin  Neurological:     Mental Status: He is alert and oriented to person, place, and time.     Motor: Motor function is intact. No weakness or tremor.     Deep Tendon Reflexes:     Reflex Scores:      Tricep reflexes are 2+ on the right side and 2+ on the left side.      Bicep reflexes are 2+ on the right side and 2+ on the left side.      Brachioradialis reflexes are 2+ on the right side and 2+ on the left side.      Patellar reflexes are 2+ on the right side and 2+ on the left side. Psychiatric:        Mood and Affect: Mood and affect normal.        Behavior: Behavior normal.        Thought Content: Thought content normal.        Judgment: Judgment normal.     Assessment/Plan: Health Maintenance Due  Topic Date Due  . COLONOSCOPY (Pts 45-70yr Insurance coverage will need to be confirmed)  Never done   Health Maintenance reviewed.  1. Preventative health care Encouraged regular exercise; encouraged him to regularly see dentist, eye doc  2. Essential hypertension Well controlled. Continue current medication.  - CBC with Differential/Platelet; Future - Comprehensive metabolic panel; Future  3. Epistaxis Discussed preventative measures to control bleeding. - Ambulatory  referral to ENT  4. Neuropathy Will check bloodwork. Normal neuro exam.  - TSH; Future - T4, free; Future - T3, free; Future  5. Hyperglycemia - Hemoglobin A1c; Future  6. Lipid screening - Lipid panel; Future  7. Fatigue, unspecified type - Vitamin B12; Future - VITAMIN D 25 Hydroxy (Vit-D Deficiency, Fractures); Future  8. Snoring Encouraged consideration for sleep study.    Return in about 1 year (around 12/07/2021).  JMicheline Rough MD

## 2020-12-09 NOTE — Addendum Note (Signed)
Addended by: Johnella Moloney on: 12/09/2020 03:51 PM   Modules accepted: Orders

## 2020-12-16 ENCOUNTER — Telehealth: Payer: Self-pay | Admitting: Family Medicine

## 2020-12-16 ENCOUNTER — Encounter: Payer: Self-pay | Admitting: Family Medicine

## 2020-12-16 NOTE — Telephone Encounter (Signed)
The patient seen Dr. Hassan Rowan on 03/07 and she prescribed him a cream for a rash but now he has a 2" x 1" spot that is now on his right face cheek  He would like to know if he needs to try something else or does he need a referral to a dermatologist?  Please advise

## 2020-12-21 NOTE — Telephone Encounter (Signed)
I had sent him a mychart message to send me a picture, but he didn't respond. I was hoping to see rash so that I could determine what would be best. If he is able to send picture of new rash on face that would be great!

## 2020-12-21 NOTE — Telephone Encounter (Signed)
Spoke with the pt and he stated he did not receive a message as he does not use his Mychart.  Patient stated he is grateful for the call and has decided to see another doctor for this. Message sent to PCP.

## 2020-12-22 NOTE — Telephone Encounter (Signed)
noted 

## 2020-12-30 ENCOUNTER — Ambulatory Visit (INDEPENDENT_AMBULATORY_CARE_PROVIDER_SITE_OTHER): Payer: BC Managed Care – PPO | Admitting: Otolaryngology

## 2020-12-30 ENCOUNTER — Other Ambulatory Visit: Payer: Self-pay

## 2020-12-30 DIAGNOSIS — H9313 Tinnitus, bilateral: Secondary | ICD-10-CM | POA: Diagnosis not present

## 2020-12-30 DIAGNOSIS — R040 Epistaxis: Secondary | ICD-10-CM

## 2020-12-30 NOTE — Progress Notes (Signed)
HPI: Jacob Fletcher is a 50 y.o. male who presents is referred by his PCP for evaluation of epistaxis.  It generally bleeds most commonly from the left side.  He has had no bleeding over the past week.  But apparently he has had a long history of nosebleeds for a number of years.  He is able to stop them relatively easily. He also complains of chronic tinnitus for a number of years.  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 He also complains of dizziness that occurs intermittently throughout the day.  Most recently he had some dizziness while pushing his kids on the swing.  He does not really describe vertigo or spinning sensation.  He does not have dizziness at night when he is in bed. He does have a history of hypertension and takes medication for this.  Cardiac alcohol right now was going on  Past Medical History:  Diagnosis Date  . Allergy   . Asthma   . Hypertension    Past Surgical History:  Procedure Laterality Date  . CHOLECYSTECTOMY    . ESOPHAGEAL DILATION    . VASECTOMY    . VASECTOMY REVERSAL     Social History   Socioeconomic History  . Marital status: Married    Spouse name: Not on file  . Number of children: 1  . Years of education: Not on file  . Highest education level: Not on file  Occupational History  . Occupation: IT  Tobacco Use  . Smoking status: Former Smoker    Types: Cigarettes  . Smokeless tobacco: Never Used  Vaping Use  . Vaping Use: Never used  Substance and Sexual Activity  . Alcohol use: Yes    Alcohol/week: 0.0 standard drinks    Comment: every 3 weeks  . Drug use: No  . Sexual activity: Not on file  Other Topics Concern  . Not on file  Social History Narrative  . Not on file   Social Determinants of Health   Financial Resource Strain: Not on file  Food Insecurity: Not on file  Transportation Needs: Not on file  Physical Activity: Not on file   Stress: Not on file  Social Connections: Not on file   Family History  Problem Relation Age of Onset  . Asthma Paternal Grandmother   . Diabetes Mother   . Thyroid disease Mother   . Hypertension Mother   . Cancer Sister        thyroid  . Hypertension Father   . Prostate cancer Maternal Uncle   . Colon cancer Neg Hx    No Known Allergies Prior to Admission medications   Medication Sig Start Date End Date Taking? Authorizing Provider  lisinopril-hydrochlorothiazide (ZESTORETIC) 10-12.5 MG tablet Take 1 tablet by mouth daily. 10/16/20   Koberlein, Paris Lore, MD  Loratadine-Pseudoephedrine (CLARITIN-D 12 HOUR PO) Take by mouth as needed.    [provider]     Positive ROS: Otherwise negative  All other systems have been reviewed and were otherwise negative with the exception of those mentioned in the HPI and as above.  Physical Exam: Constitutional: Alert, well-appearing, no acute distress Ears: External ears without lesions or tenderness. Ear canals are clear bilaterally with minimal wax buildup that was cleaned in the office.  TMs were clear bilaterally with no middle ear space abnormality noted.  Dix-Hallpike testing was negative for any evidence  of BPPV.  Hearing screening with the 1024 tuning fork revealed minimal hearing loss.  I suspect he has more of a high-frequency hearing loss contributing to the tinnitus. Nasal: External nose without lesions. Septum with a minimal deformity.  No mucosal lesions noted although he does have some mildly prominent vessels in the region of Kiesselbach's plexus more so on the left side.  There are no polyps noted in the middle meatus is clear bilaterally with no signs of infection..  Oral: Lips and gums without lesions. Tongue and palate mucosa without lesions. Posterior oropharynx clear. Neck: No palpable adenopathy or masses Respiratory: Breathing comfortably  Skin: No facial/neck lesions or rash  noted.  Procedures  Assessment: History of recurrent epistaxis most likely related to bleeding from Kiesselbach's plexus from dry mucosal membranes. 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 Dizziness questionable etiology but normal ear examination and no clinical evidence of BPPV.  Plan: Suggested using nasal gel as a moisturizer for the nose and this may help decrease epistaxis.  Reviewed with him concerning using a cottonball and Afrin to help stop nosebleed if he has a bad nosebleed.  If he continues to have nosebleeds he will contact our office concerning follow-up here shortly following a nosebleed to have cauterization if necessary.  But I cannot definitely discern a site or origin of his epistaxis on exam in the office today.   Narda Bonds, MD   CC:

## 2021-01-01 ENCOUNTER — Other Ambulatory Visit: Payer: Self-pay | Admitting: Family Medicine

## 2021-06-30 ENCOUNTER — Other Ambulatory Visit: Payer: Self-pay | Admitting: Family Medicine

## 2021-10-15 ENCOUNTER — Encounter: Payer: Self-pay | Admitting: Family Medicine

## 2021-10-15 ENCOUNTER — Telehealth (INDEPENDENT_AMBULATORY_CARE_PROVIDER_SITE_OTHER): Payer: BC Managed Care – PPO | Admitting: Family Medicine

## 2021-10-15 DIAGNOSIS — R058 Other specified cough: Secondary | ICD-10-CM | POA: Diagnosis not present

## 2021-10-15 DIAGNOSIS — R002 Palpitations: Secondary | ICD-10-CM

## 2021-10-15 DIAGNOSIS — J4520 Mild intermittent asthma, uncomplicated: Secondary | ICD-10-CM

## 2021-10-15 DIAGNOSIS — Z9109 Other allergy status, other than to drugs and biological substances: Secondary | ICD-10-CM | POA: Diagnosis not present

## 2021-10-15 MED ORDER — ALBUTEROL SULFATE HFA 108 (90 BASE) MCG/ACT IN AERS: 2.0000 | INHALATION_SPRAY | Freq: Four times a day (QID) | RESPIRATORY_TRACT | 2 refills | Status: AC | PRN

## 2021-10-15 MED ORDER — AEROCHAMBER PLUS MISC: 0 refills | Status: DC

## 2021-10-15 NOTE — Progress Notes (Signed)
Virtual Visit via Video Note  I connected with Jacob Fletcher  on 10/15/21 at 11:30 AM EST by a video enabled telemedicine application and verified that I am speaking with the correct person using two identifiers.  Location patient: home Location provider: [facility]  [address] [place], [place] [address] Persons participating in the virtual visit: patient, provider  I discussed the limitations of evaluation and management by telemedicine and the availability of in person appointments. The patient expressed understanding and agreed to proceed.   Jacob Fletcher DOB: December 04, 1970 Encounter date: 10/15/2021  This is a 51 y.o. male who presents with Chief Complaint  Patient presents with   Shortness of Breath    Patient complains of shortness of breath x2 weeks after being at a family members home on New Years and being exposed to pine, also complains of a "fluttering in upper chest, difficulty getting full breath" for years, states he has been seen for this problem with no diagnosis given, symptoms are especially noted prior to a meeting at work or other stressful event , states he was diagnosed with asthma as child and Dr Tawanna Cooler gave 2 inhalers years ago which have expired, requests new inhalers    Cough    History of present illness: Week of new years was traveling and he had asthma attach at mother- in laws house. He had inhalers in past; but hasn't used in years. Just let them drop off med list. Wasn't even listed as medication. Old inhaler has expired. He went outdoors and reverted back to breathing exercises that he did as a child. Slowing breathing.   Had bad asthma as child. Went through years of allergy/asthma as child.   States that sx were compounded by anxiety/stress. Job right now is extremely stressful. Starting to see that come into breathing.   Was in office last year for upper chest palpitations/skipping beats. Feels like fluttering in top part of chest  and when he tries to get in breath - it is like he is gasping for air. He had this sensation for weeks leading up to asthma attack. Fluttering is not constant. Hasn't noted in a day or two. Notes fluttering at night, when trying to relax. Happens before stressful meeting when he knows he is going to get in trouble. Always seems that work is related. At night can last hours. Not sure how long it lasts during day.   Has blood pressure cuff, but hasn't been checking it. Most days he takes the lisinopril.   Knows he has gained some weight- pants aren't fitting as well.   Last week took down christmas stuff- but able to go up and down flights of stairs without palpitations or trouble with breathing.   No Known Allergies Current Meds  Medication Sig   lisinopril-hydrochlorothiazide (ZESTORETIC) 10-12.5 MG tablet TAKE 1 TABLET DAILY   Loratadine-Pseudoephedrine (CLARITIN-D 12 HOUR PO) Take by mouth as needed.    Review of Systems  Constitutional:  Negative for chills, fatigue and fever.  Respiratory:  Negative for cough, chest tightness, shortness of breath and wheezing.   Cardiovascular:  Positive for palpitations. Negative for chest pain and leg swelling.   Objective:  There were no vitals taken for this visit.      BP Readings from Last 3 Encounters:  12/07/20 100/72  06/16/20 116/80  11/08/19 (!) 126/93   Wt Readings from Last 3 Encounters:  12/07/20 186 lb 12.8 oz (84.7 kg)  06/16/20 190 lb 12.8  oz (86.5 kg)  07/24/19 182 lb 4.8 oz (82.7 kg)    EXAM:  GENERAL: alert, oriented, appears well and in no acute distress  HEENT: atraumatic, conjunctiva clear, no obvious abnormalities on inspection of external nose and ears  NECK: normal movements of the head and neck  LUNGS: on inspection no signs of respiratory distress, breathing rate appears normal, no obvious gross SOB, gasping or wheezing  CV: no obvious cyanosis  MS: moves all visible extremities without noticeable  abnormality  PSYCH/NEURO: pleasant and cooperative, no obvious depression or anxiety, speech and thought processing grossly intact   Assessment/Plan  1. Environmental allergies These have been stable. Claritin prn.   2. Mild intermittent asthma without complication Albuterol to have on hand. Discussed spacingchamber. Also has some dry cough. Discussed stopping lisinopril, but he doesn't want to yet.encouraged him to consider. Cough is intermittent.  - albuterol (VENTOLIN HFA) 108 (90 Base) MCG/ACT inhaler; Inhale 2 puffs into the lungs every 6 (six) hours as needed for wheezing or shortness of breath.  Dispense: 8 g; Refill: 2 - Spacer/Aero-Holding Chambers (AEROCHAMBER PLUS) inhaler; Use as instructed  Dispense: 1 each; Refill: 0  3. Palpitations He has had these for awhile. Didn't return for prior follow up bloodwork. Encouraged him to call backto set up lab appointment. Getting monitor. Seem to be stress induced. Further eval pending this response. - Cardiac event monitor; Future      I discussed the assessment and treatment plan with the patient. The patient was provided an opportunity to ask questions and all were answered. The patient agreed with the plan and demonstrated an understanding of the instructions.   The patient was advised to call back or seek an in-person evaluation if the symptoms worsen or if the condition fails to improve as anticipated.  I provided 30 minutes of face-to-face time during this encounter.   Theodis Shove, MD

## 2021-10-20 DIAGNOSIS — L738 Other specified follicular disorders: Secondary | ICD-10-CM | POA: Diagnosis not present

## 2021-10-20 DIAGNOSIS — L814 Other melanin hyperpigmentation: Secondary | ICD-10-CM | POA: Diagnosis not present

## 2021-10-20 DIAGNOSIS — L821 Other seborrheic keratosis: Secondary | ICD-10-CM | POA: Diagnosis not present

## 2021-10-23 ENCOUNTER — Ambulatory Visit (INDEPENDENT_AMBULATORY_CARE_PROVIDER_SITE_OTHER): Payer: BC Managed Care – PPO

## 2021-10-23 DIAGNOSIS — R002 Palpitations: Secondary | ICD-10-CM | POA: Diagnosis not present

## 2021-12-27 ENCOUNTER — Other Ambulatory Visit: Payer: Self-pay | Admitting: Family Medicine

## 2022-03-22 ENCOUNTER — Other Ambulatory Visit: Payer: Self-pay | Admitting: *Deleted

## 2022-03-22 MED ORDER — LISINOPRIL-HYDROCHLOROTHIAZIDE 10-12.5 MG PO TABS: 1.0000 | ORAL_TABLET | Freq: Every day | ORAL | 0 refills | Status: DC

## 2022-06-18 ENCOUNTER — Other Ambulatory Visit: Payer: Self-pay | Admitting: Internal Medicine

## 2022-07-15 ENCOUNTER — Encounter: Payer: Self-pay | Admitting: Family Medicine

## 2022-07-15 ENCOUNTER — Ambulatory Visit (INDEPENDENT_AMBULATORY_CARE_PROVIDER_SITE_OTHER): Payer: BC Managed Care – PPO | Admitting: Family Medicine

## 2022-07-15 VITALS — BP 102/78 | HR 90 | Temp 98.1°F | Ht 64.5 in | Wt 192.8 lb

## 2022-07-15 DIAGNOSIS — E559 Vitamin D deficiency, unspecified: Secondary | ICD-10-CM

## 2022-07-15 DIAGNOSIS — I10 Essential (primary) hypertension: Secondary | ICD-10-CM

## 2022-07-15 MED ORDER — LISINOPRIL-HYDROCHLOROTHIAZIDE 10-12.5 MG PO TABS: 1.0000 | ORAL_TABLET | Freq: Every day | ORAL | 3 refills | Status: DC

## 2022-07-15 NOTE — Progress Notes (Signed)
Established Patient Office Visit  Subjective   Patient ID: Jacob Fletcher, male    DOB: Oct 21, 1970  Age: 51 y.o. MRN: [id number]  Chief Complaint  Patient presents with   Establish Care    Patient is here for transition of care visit.   Patient reports he used to see Dr. Tawanna Cooler for his care previously, was switched to Dr. Hassan Rowan.   HTN -- BP in office performed and is well controlled. He  reports no side effects to the medications, no chest pain, SOB, dizziness or headaches. He has a BP cuff at home but reports he does not check his BP regularly.   States that other than some general aches and pains he has not new symptoms or issues, states that he is having some trouble with range of motion with his left shoulder, states that is not able to sleep on his side. States that it is just an annoyance, not severely painful.  We discussed his health maintenance measures. He is due for his colon cancer screening currently. He declined colon cancer screening at this time, declined flu shot.       Current Outpatient Medications  Medication Instructions   albuterol (VENTOLIN HFA) 108 (90 Base) MCG/ACT inhaler 2 puffs, Inhalation, Every 6 hours PRN   lisinopril-hydrochlorothiazide (ZESTORETIC) 10-12.5 MG tablet 1 tablet, Oral, Daily   Loratadine-Pseudoephedrine (CLARITIN-D 12 HOUR PO) Oral, As needed   Spacer/Aero-Holding Chambers (AEROCHAMBER PLUS) inhaler Use as instructed    Patient Active Problem List   Diagnosis Date Noted   Vitamin D deficiency 07/15/2022   Gout involving toe of left foot 06/25/2018   Essential hypertension 06/25/2018   Abnormal liver function tests 06/06/2018   Fever 01/25/2015   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     Objective:     BP 102/78 (BP Location: Left Arm,  Patient Position: Sitting, Cuff Size: Large)   Pulse 90   Temp 98.1 F (36.7 C) (Oral)   Ht 5' 4.5" (1.638 m)   Wt 192 lb 12.8 oz (87.5 kg)   SpO2 98%   BMI 32.58 kg/m    Physical Exam Vitals reviewed.  Constitutional:      Appearance: Normal appearance. He is well-groomed and normal weight.  Eyes:     Conjunctiva/sclera: Conjunctivae normal.  Neck:     Thyroid: No thyromegaly.  Cardiovascular:     Rate and Rhythm: Normal rate and regular rhythm.     Heart sounds: S1 normal and S2 normal. No murmur heard. Pulmonary:     Effort: Pulmonary effort is normal.     Breath sounds: Normal breath sounds and air entry. No rales.  Abdominal:     General: Bowel sounds are normal.  Neurological:     General: No focal deficit present.     Mental Status: He is alert and oriented to person, place, and time.     Gait: Gait is intact.  Psychiatric:        Mood and Affect: Mood and affect normal.      No results found for any visits on 07/15/22.    The 10-year ASCVD risk score (Arnett DK, et al., 2019) is: 2.9%    Assessment & Plan:   Problem List Items Addressed This Visit       Cardiovascular and Mediastinum  Essential hypertension - Primary    Current hypertension medications:       Sig   lisinopril-hydrochlorothiazide (ZESTORETIC) 10-12.5 MG tablet Take 1 tablet by mouth daily.  BP well controlled on the above medication, will continue as prescribed, refills sent. Pt is due for his annual labs today, will place orders for CMP and lipid panel for CVD risk stratification.      Relevant Medications   lisinopril-hydrochlorothiazide (ZESTORETIC) 10-12.5 MG tablet   Other Relevant Orders   CMP   Lipid Panel     Other   Vitamin D deficiency    See on labs last year, will obtain new level this year for surveillance.       Relevant Orders   Vitamin D, 25-hydroxy    Return in about 1 year (around 07/16/2023) for Annual Physical Exam.    Farrel Conners, MD

## 2022-07-15 NOTE — Assessment & Plan Note (Signed)
See on labs last year, will obtain new level this year for surveillance.

## 2022-07-15 NOTE — Assessment & Plan Note (Addendum)
Current hypertension medications:      Sig   lisinopril-hydrochlorothiazide (ZESTORETIC) 10-12.5 MG tablet Take 1 tablet by mouth daily.     BP well controlled on the above medication, will continue as prescribed, refills sent. Pt is due for his annual labs today, will place orders for CMP and lipid panel for CVD risk stratification.

## 2022-07-29 ENCOUNTER — Other Ambulatory Visit (INDEPENDENT_AMBULATORY_CARE_PROVIDER_SITE_OTHER): Payer: BC Managed Care – PPO

## 2022-07-29 DIAGNOSIS — I10 Essential (primary) hypertension: Secondary | ICD-10-CM | POA: Diagnosis not present

## 2022-07-29 DIAGNOSIS — E559 Vitamin D deficiency, unspecified: Secondary | ICD-10-CM | POA: Diagnosis not present

## 2022-07-29 LAB — COMPREHENSIVE METABOLIC PANEL
ALT: 75 U/L — ABNORMAL HIGH (ref 0–53)
AST: 33 U/L (ref 0–37)
Albumin: 4.3 g/dL (ref 3.5–5.2)
Alkaline Phosphatase: 66 U/L (ref 39–117)
BUN: 12 mg/dL (ref 6–23)
CO2: 31 mEq/L (ref 19–32)
Calcium: 9.4 mg/dL (ref 8.4–10.5)
Chloride: 100 mEq/L (ref 96–112)
Creatinine, Ser: 1.05 mg/dL (ref 0.40–1.50)
GFR: 82.91 mL/min (ref >60.00)
Glucose, Bld: 96 mg/dL (ref 70–99)
Potassium: 4.2 mEq/L (ref 3.5–5.1)
Sodium: 138 mEq/L (ref 135–145)
Total Bilirubin: 0.9 mg/dL (ref 0.2–1.2)
Total Protein: 7 g/dL (ref 6.0–8.3)

## 2022-07-29 LAB — LIPID PANEL
Cholesterol: 155 mg/dL (ref 0–200)
HDL: 35 mg/dL — ABNORMAL LOW (ref >39.00)
LDL Cholesterol: 100 mg/dL — ABNORMAL HIGH (ref 0–99)
NonHDL: 120.35
Total CHOL/HDL Ratio: 4
Triglycerides: 101 mg/dL (ref 0.0–149.0)
VLDL: 20.2 mg/dL (ref 0.0–40.0)

## 2022-07-29 LAB — VITAMIN D 25 HYDROXY (VIT D DEFICIENCY, FRACTURES): VITD: 16.7 ng/mL — ABNORMAL LOW (ref 30.00–100.00)

## 2022-08-02 MED ORDER — VITAMIN D3 50 MCG (2000 UT) PO CAPS: 2000.0000 [IU] | ORAL_CAPSULE | Freq: Every day | ORAL | 5 refills | Status: DC

## 2022-08-31 DIAGNOSIS — H53143 Visual discomfort, bilateral: Secondary | ICD-10-CM | POA: Diagnosis not present

## 2022-11-28 ENCOUNTER — Ambulatory Visit: Payer: BC Managed Care – PPO | Admitting: Family Medicine

## 2022-11-28 ENCOUNTER — Encounter: Payer: Self-pay | Admitting: Family Medicine

## 2022-11-28 VITALS — BP 102/68 | HR 75 | Temp 97.9°F | Ht 64.5 in | Wt 201.3 lb

## 2022-11-28 DIAGNOSIS — E559 Vitamin D deficiency, unspecified: Secondary | ICD-10-CM

## 2022-11-28 DIAGNOSIS — Z1211 Encounter for screening for malignant neoplasm of colon: Secondary | ICD-10-CM | POA: Diagnosis not present

## 2022-11-28 DIAGNOSIS — L570 Actinic keratosis: Secondary | ICD-10-CM | POA: Diagnosis not present

## 2022-11-28 DIAGNOSIS — R42 Dizziness and giddiness: Secondary | ICD-10-CM | POA: Diagnosis not present

## 2022-11-28 LAB — VITAMIN D 25 HYDROXY (VIT D DEFICIENCY, FRACTURES): VITD: 33.18 ng/mL (ref 30.00–100.00)

## 2022-11-28 MED ORDER — MECLIZINE HCL 25 MG PO TABS: 12.5000 mg | ORAL_TABLET | Freq: Three times a day (TID) | ORAL | 0 refills | Status: DC | PRN

## 2022-11-28 MED ORDER — FLUOROURACIL 5 % EX CREA: TOPICAL_CREAM | Freq: Two times a day (BID) | CUTANEOUS | 0 refills | Status: DC

## 2022-11-28 NOTE — Progress Notes (Signed)
Established Patient Office Visit  Subjective   Patient ID: Jacob Fletcher, male    DOB: 1971/09/25  Age: 52 y.o. MRN: [id number]  Chief Complaint  Patient presents with   Dizziness    Patient complains of dizziness x10-12 years, especially concerned due to upcoming flight and requests medication to help relax him to help with motion sickness   Requests eval of lesion on nose x2 months    Pt reports a long >10 years of dizziness, states that he has had this off and on for some time, reports he has tried the maneuvers at home in the past. Main concern is that he is taking an international flight soon and was worried that his motion sickness would get worse on the flight. States that he even has trouble watching his daughter on the swing set or when she is spinning around. States that it makes him very nauseous.  Pt has been taking his OTC vitamin D supplements, will order new level today for follow up.   Pt also reports that he has a new skin lesion on his nose that has been there for the past few months. He states that it is rough and it has not resolved. Not growing any larger.   Current Outpatient Medications  Medication Instructions   albuterol (VENTOLIN HFA) 108 (90 Base) MCG/ACT inhaler 2 puffs, Inhalation, Every 6 hours PRN   fluorouracil (EFUDEX) 5 % cream Topical, 2 times daily   lisinopril-hydrochlorothiazide (ZESTORETIC) 10-12.5 MG tablet 1 tablet, Oral, Daily   Loratadine-Pseudoephedrine (CLARITIN-D 12 HOUR PO) Oral, As needed   meclizine (ANTIVERT) 12.5-25 mg, Oral, 3 times daily PRN   Spacer/Aero-Holding Chambers (AEROCHAMBER PLUS) inhaler Use as instructed   Vitamin D3 2,000 Units, Oral, Daily    Patient Active Problem List   Diagnosis Date Noted   Vitamin D deficiency 07/15/2022   Gout involving toe of left foot 06/25/2018   Essential hypertension 06/25/2018   Abnormal liver function tests 06/06/2018   Fever 01/25/2015   Paralysis of both lower limbs ([place])  01/25/2015   Near syncope 01/24/2015   Weakness generalized 01/24/2015   Environmental allergies 09/07/2012      Review of Systems  All other systems reviewed and are negative.     Objective:     BP 102/68 (BP Location: Left Arm, Patient Position: Sitting, Cuff Size: Normal)   Pulse 75   Temp 97.9 F (36.6 C) (Oral)   Ht 5' 4.5" (1.638 m)   Wt 201 lb 4.8 oz (91.3 kg)   SpO2 95%   BMI 34.02 kg/m    Physical Exam Vitals reviewed.  Constitutional:      Appearance: Normal appearance. He is well-groomed and normal weight.  Eyes:     Conjunctiva/sclera: Conjunctivae normal.  Cardiovascular:     Rate and Rhythm: Normal rate and regular rhythm.     Heart sounds: S1 normal and S2 normal. No murmur heard. Pulmonary:     Effort: Pulmonary effort is normal.     Breath sounds: Normal air entry.  Neurological:     General: No focal deficit present.     Mental Status: He is alert and oriented to person, place, and time.     Gait: Gait is intact.  Psychiatric:        Mood and Affect: Mood and affect normal.        Behavior: Behavior normal.      Results for orders placed or performed in visit on 11/28/22  Vitamin D,  25-hydroxy  Result Value Ref Range   VITD 33.18 30.00 - 100.00 ng/mL      The 10-year ASCVD risk score (Arnett DK, et al., 2019) is: 2.8%    Assessment & Plan:   Problem List Items Addressed This Visit       Unprioritized   Vitamin D deficiency    On OTC replacement, will check new level today      Relevant Orders   Vitamin D, 25-hydroxy (Completed)   Other Visit Diagnoses     Vertigo    -  Primary   most likely BPPV . will give patient meclizine 12.5 mg every 8 hours PRN for his international flight.   Relevant Medications   meclizine (ANTIVERT) 25 MG tablet   Actinic keratosis       most likely, will treat with 5- fu cream to be applied twice daily  for 4 weeks   Relevant Medications   fluorouracil (EFUDEX) 5 % cream   Colon cancer  screening       Relevant Orders   Ambulatory referral to Gastroenterology       Return in about 8 months (around 07/29/2023) for annual physical exam.    Farrel Conners, MD

## 2022-11-28 NOTE — Assessment & Plan Note (Signed)
On OTC replacement, will check new level today

## 2022-12-09 ENCOUNTER — Ambulatory Visit: Payer: BC Managed Care – PPO | Admitting: Family Medicine

## 2022-12-09 ENCOUNTER — Encounter: Payer: Self-pay | Admitting: Family Medicine

## 2022-12-09 VITALS — BP 100/70 | HR 85 | Temp 97.8°F | Ht 64.5 in | Wt 202.7 lb

## 2022-12-09 DIAGNOSIS — H6592 Unspecified nonsuppurative otitis media, left ear: Secondary | ICD-10-CM

## 2022-12-09 MED ORDER — PSEUDOEPHEDRINE HCL 60 MG PO TABS: 60.0000 mg | ORAL_TABLET | ORAL | 0 refills | Status: DC | PRN

## 2022-12-09 MED ORDER — AMOXICILLIN 500 MG PO CAPS: 500.0000 mg | ORAL_CAPSULE | Freq: Three times a day (TID) | ORAL | 0 refills | Status: DC

## 2022-12-09 NOTE — Progress Notes (Signed)
   Acute Office Visit  Subjective:     Patient ID: Jacob Fletcher, male    DOB: 01-31-1971, 52 y.o.   MRN: [id number]  Chief Complaint  Patient presents with   Ear Pain    Patient complains of left ear pressure x4 days, difficulty hearing also, history of playing in a band    HPI Patient is in today for left ear  and pain for 4 days, difficulty hearing in that ear, some sinus congestion but nothing severe.  Thought maybe he had a cold but didn't feel any fever or chills.   Review of Systems  All other systems reviewed and are negative.       Objective:    BP 100/70 (BP Location: Left Arm, Patient Position: Sitting, Cuff Size: Large)   Pulse 85   Temp 97.8 F (36.6 C) (Oral)   Ht 5' 4.5" (1.638 m)   Wt 202 lb 11.2 oz (91.9 kg)   SpO2 100%   BMI 34.26 kg/m    Physical Exam Vitals reviewed.  Constitutional:      Appearance: Normal appearance. He is obese.  HENT:     Right Ear: Tympanic membrane normal.     Left Ear: Tympanic membrane is injected and bulging.  Cardiovascular:     Rate and Rhythm: Normal rate and regular rhythm.  Neurological:     Mental Status: He is alert.     No results found for any visits on 12/09/22.      Assessment & Plan:   Problem List Items Addressed This Visit   None Visit Diagnoses     Middle ear effusion, left    -  Primary   Relevant Medications   pseudoephedrine (SUDAFED) 60 MG tablet   amoxicillin (AMOXIL) 500 MG capsule     Most likely pt is developing an infection of the left TM, it is extremly painful for him and his tinnitus is much worse today. I advised he continue to "pop" his ears and will use the medications I prescribed below to help resolve the condition. If no better then I would recommend referral to ENT.  Meds ordered this encounter  Medications   pseudoephedrine (SUDAFED) 60 MG tablet    Sig: Take 1 tablet (60 mg total) by mouth every 4 (four) hours as needed for congestion.    Dispense:  30 tablet     Refill:  0   amoxicillin (AMOXIL) 500 MG capsule    Sig: Take 1 capsule (500 mg total) by mouth 3 (three) times daily for 10 days.    Dispense:  30 capsule    Refill:  0    No follow-ups on file.  Farrel Conners, MD

## 2022-12-16 ENCOUNTER — Telehealth: Payer: Self-pay | Admitting: Family Medicine

## 2022-12-16 ENCOUNTER — Other Ambulatory Visit: Payer: Self-pay

## 2022-12-16 DIAGNOSIS — H9313 Tinnitus, bilateral: Secondary | ICD-10-CM

## 2022-12-16 DIAGNOSIS — R42 Dizziness and giddiness: Secondary | ICD-10-CM

## 2022-12-16 DIAGNOSIS — H6592 Unspecified nonsuppurative otitis media, left ear: Secondary | ICD-10-CM

## 2022-12-16 NOTE — Telephone Encounter (Addendum)
Pt was seen on 12/09/22 for pressure in his ear.  Pt states the humming in his ear has gotten progressively worse and would like a referral to see an ENT.

## 2022-12-16 NOTE — Telephone Encounter (Signed)
Referral placed to ENT per last OV notes.

## 2022-12-19 ENCOUNTER — Ambulatory Visit: Payer: BC Managed Care – PPO | Admitting: Family Medicine

## 2022-12-19 ENCOUNTER — Encounter: Payer: Self-pay | Admitting: Family Medicine

## 2022-12-19 VITALS — BP 102/80 | HR 84 | Temp 97.5°F | Ht 64.5 in | Wt 199.9 lb

## 2022-12-19 DIAGNOSIS — Z883 Allergy status to other anti-infective agents status: Secondary | ICD-10-CM | POA: Diagnosis not present

## 2022-12-19 NOTE — Progress Notes (Unsigned)
   Acute Office Visit  Subjective:     Patient ID: Jacob Fletcher, male    DOB: 1971/09/12, 52 y.o.   MRN: [id number]  Chief Complaint  Patient presents with   Medication Reaction    Patient states he noticed an itchy rash over the entire body and heavy sensation in the chest x2 days, tried Claritin and inhaler and questioned if this could be related to taking Amoxicllin given for an ear infection, discontinued 3 days ago, states he called the on-call nurse, was advised to call 911 and declined    HPI Patient is in today for possible allergic reaction to the amoxicillin. States that he started it on the 9th and he took about 7 days of the medication, states that the rash started about 5 days ago, states that he was itching all over, it woke him from sleep. States that he has been taking claritin and using his inhaler which is helping. But he reports that the rash is still very itchy.   Review of Systems  Constitutional:  Negative for chills and fever.  HENT:  Negative for congestion and ear pain.   Respiratory:  Positive for shortness of breath.   Skin:  Positive for itching and rash.  All other systems reviewed and are negative.       Objective:    BP 102/80 (BP Location: Left Arm, Patient Position: Sitting, Cuff Size: Large)   Pulse 84   Temp (!) 97.5 F (36.4 C) (Oral)   Ht 5' 4.5" (1.638 m)   Wt 199 lb 14.4 oz (90.7 kg)   SpO2 98%   BMI 33.78 kg/m  {Vitals History (Optional):23777}  Physical Exam Neurological:     General: No focal deficit present.     Mental Status: He is oriented to person, place, and time. Mental status is at baseline.     No results found for any visits on 12/19/22.      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   No follow-ups on file.  Farrel Conners, MD

## 2023-03-28 DIAGNOSIS — R42 Dizziness and giddiness: Secondary | ICD-10-CM | POA: Diagnosis not present

## 2023-03-28 DIAGNOSIS — H903 Sensorineural hearing loss, bilateral: Secondary | ICD-10-CM | POA: Diagnosis not present

## 2023-03-28 DIAGNOSIS — H9313 Tinnitus, bilateral: Secondary | ICD-10-CM | POA: Diagnosis not present

## 2023-03-28 DIAGNOSIS — H6592 Unspecified nonsuppurative otitis media, left ear: Secondary | ICD-10-CM | POA: Diagnosis not present

## 2023-04-04 ENCOUNTER — Telehealth: Payer: Self-pay | Admitting: Family Medicine

## 2023-04-04 DIAGNOSIS — I10 Essential (primary) hypertension: Secondary | ICD-10-CM

## 2023-04-04 NOTE — Telephone Encounter (Signed)
Prescription Request  04/04/2023  LOV: 12/19/2022  What is the name of the medication or equipment? lisinopril-hydrochlorothiazide (ZESTORETIC) 10-12.5 MG tablet  Have you contacted your pharmacy to request a refill? Yes   Which pharmacy would you like this sent to?      [facility] - [place], [place] - [address] [facility] AT Portal to [employer] [employer] (Pharmacy) [phone]    Patient notified that their request is being sent to the clinical staff for review and that they should receive a response within 2 business days.   Please advise at Mobile [phone] (mobile)

## 2023-04-05 MED ORDER — LISINOPRIL-HYDROCHLOROTHIAZIDE 10-12.5 MG PO TABS: 1.0000 | ORAL_TABLET | Freq: Every day | ORAL | 0 refills | Status: DC

## 2023-04-05 NOTE — Telephone Encounter (Signed)
Rx done. 

## 2023-04-12 ENCOUNTER — Other Ambulatory Visit: Payer: Self-pay | Admitting: Family Medicine

## 2023-04-12 DIAGNOSIS — I10 Essential (primary) hypertension: Secondary | ICD-10-CM

## 2023-04-12 MED ORDER — LISINOPRIL-HYDROCHLOROTHIAZIDE 10-12.5 MG PO TABS: 1.0000 | ORAL_TABLET | Freq: Every day | ORAL | 1 refills | Status: DC

## 2023-04-13 DIAGNOSIS — H8193 Unspecified disorder of vestibular function, bilateral: Secondary | ICD-10-CM | POA: Diagnosis not present

## 2023-05-11 ENCOUNTER — Encounter: Payer: Self-pay | Admitting: Family Medicine

## 2023-05-11 ENCOUNTER — Ambulatory Visit: Payer: BC Managed Care – PPO | Admitting: Family Medicine

## 2023-05-11 VITALS — BP 126/86 | HR 70 | Temp 98.0°F | Wt 197.0 lb

## 2023-05-11 DIAGNOSIS — M79672 Pain in left foot: Secondary | ICD-10-CM | POA: Diagnosis not present

## 2023-05-11 MED ORDER — METHYLPREDNISOLONE 4 MG PO TBPK: ORAL_TABLET | ORAL | 0 refills | Status: DC

## 2023-05-11 NOTE — Progress Notes (Signed)
   Subjective:    Patient ID: Jacob Fletcher, male    DOB: 1971/06/11, 52 y.o.   MRN: [id number]  HPI Here for 2 weeks of swelling and pain in the left foot. No recent trauma. He has done nothing for it. He has a hx of gout but he says this usually affects the great toe.    Review of Systems  Constitutional: Negative.   Respiratory: Negative.    Cardiovascular: Negative.   Musculoskeletal:  Positive for arthralgias.       Objective:   Physical Exam Constitutional:      Appearance: Normal appearance.  Cardiovascular:     Rate and Rhythm: Normal rate and regular rhythm.     Pulses: Normal pulses.     Heart sounds: Normal heart sounds.  Pulmonary:     Effort: Pulmonary effort is normal.     Breath sounds: Normal breath sounds.  Musculoskeletal:     Comments: The medial side of the left foot is swollen, pink, and tender. This extends up to the media malleolus. No warmth  Neurological:     Mental Status: He is alert.           Assessment & Plan:  This is consistent with gout. He will stay off the foot and elevate it. Given a Medrol dose pack.  Gershon Crane, MD

## 2023-06-23 DIAGNOSIS — R42 Dizziness and giddiness: Secondary | ICD-10-CM | POA: Diagnosis not present

## 2023-06-23 DIAGNOSIS — Z77122 Contact with and (suspected) exposure to noise: Secondary | ICD-10-CM | POA: Diagnosis not present

## 2023-06-23 DIAGNOSIS — H9312 Tinnitus, left ear: Secondary | ICD-10-CM | POA: Diagnosis not present

## 2023-10-05 ENCOUNTER — Ambulatory Visit: Payer: Self-pay | Admitting: Family Medicine

## 2023-10-05 NOTE — Telephone Encounter (Signed)
 Copied from CRM [id number]. Topic: Clinical - Medical Advice >> Oct 05, 2023  9:17 AM Graeme ORN wrote: Reason for CRM: Patient called for appt with symptoms of strong dry cough and congestion. Same symptoms as his daughter who was diagnosed with pneumonia. Patient wants to make sure they do not have pneumonia as well. Scheduled for Tuesday.

## 2023-10-05 NOTE — Telephone Encounter (Signed)
 Chief Complaint: Dry cough Symptoms: Cough and generalized tiredness Frequency: x 6 days Pertinent Negatives: Patient denies difficulty breathing Disposition: [] ED /[x] Urgent Care (no appt availability in office) / [x] Appointment(In office/virtual)/ []  [facility]/ [] Home Care/ [x] Refused Recommended Disposition /[] [facility]/ []  Follow-up with PCP Additional Notes: Patient called and advised his family has been traveling for the holidays and he has had this dry cough for the past 6 days.  Patient states that he doesn't feel terrible and has no trouble breathing.  Patient states that he has had continuous coughing that doesn't seem to get better at home and did state that he had a long car ride on the 21st of December, felt fine for 5 days and then his coughing symptoms started on the 27th and then he had another long car ride.  He denies any difficulty breathing and just wanted to get checked out because his daughter was just diagnosed with walking pneumonia this morning at her pediatrician.  Patient states just a dry cough and he had already set up an appointment for next Tuesday 10/10/23 at 10:30am prior to Nurse Triage with this RN.  While on the phone, patient only had one episode of coughing and was able to speak in full sentences.  Patient states he was offered appointment times on Monday but due to his work he was just unable to make any of those appointment times.  He states that he does have some general tiredness but he also had stopped taking in a lot of sugar and sweet drinks.  He is advised that the recommendation is to be seen within 24 hours by a provider and he knows of urgent cares nearby where he is.  He states that he would like to keep his appointment on Tuesday and if anything changes or worsens he will go to an urgent care sooner. He is given some home care advice as well. He is advised that if he does get worse to be seen sooner at an urgent care or emergency  room.  He verbalized understanding of this and stated that his wife would also be looking after him if he gets worse to go be seen sooner.  Reason for Disposition  [1] Continuous (nonstop) coughing interferes with work or school AND [2] no improvement using cough treatment per Care Advice  Answer Assessment - Initial Assessment Questions 1. ONSET: When did the cough begin?      6 days ago 2. SEVERITY: How bad is the cough today?      Can only speak for a little bit  3. SPUTUM: Describe the color of your sputum (none, dry cough; clear, white, yellow, green)     Dry cough---if he coughs hard in the shower some clear white phlegm 4. HEMOPTYSIS: Are you coughing up any blood? If so ask: How much? (flecks, streaks, tablespoons, etc.)     No 5. DIFFICULTY BREATHING: Are you having difficulty breathing? If Yes, ask: How bad is it? (e.g., mild, moderate, severe)    - MILD: No SOB at rest, mild SOB with walking, speaks normally in sentences, can lie down, no retractions, pulse < 100.    - MODERATE: SOB at rest, SOB with minimal exertion and prefers to sit, cannot lie down flat, speaks in phrases, mild retractions, audible wheezing, pulse 100-120.    - SEVERE: Very SOB at rest, speaks in single words, struggling to breathe, sitting hunched forward, retractions, pulse > 120      No shortness  of breath 6. FEVER: Do you have a fever? If Yes, ask: What is your temperature, how was it measured, and when did it start?     No 7. CARDIAC HISTORY: Do you have any history of heart disease? (e.g., heart attack, congestive heart failure)      No 8. LUNG HISTORY: Do you have any history of lung disease?  (e.g., pulmonary embolus, asthma, emphysema)     Asthma as a child 9. PE RISK FACTORS: Do you have a history of blood clots? (or: recent major surgery, recent prolonged travel, bedridden)     11 hours each way to [place]  10. OTHER SYMPTOMS: Do you have any other symptoms? (e.g., runny  nose, wheezing, chest pain)       Generalized malaise 12. TRAVEL: Have you traveled out of the country in the last month? (e.g., travel history, exposures)       No  Protocols used: Cough - Acute Non-Productive-A-AH

## 2023-10-10 ENCOUNTER — Ambulatory Visit (INDEPENDENT_AMBULATORY_CARE_PROVIDER_SITE_OTHER): Payer: BC Managed Care – PPO | Admitting: Family Medicine

## 2023-10-10 ENCOUNTER — Encounter: Payer: Self-pay | Admitting: Family Medicine

## 2023-10-10 VITALS — BP 120/70 | HR 95 | Temp 97.7°F | Resp 16 | Ht 64.5 in | Wt 203.4 lb

## 2023-10-10 DIAGNOSIS — R059 Cough, unspecified: Secondary | ICD-10-CM

## 2023-10-10 DIAGNOSIS — J069 Acute upper respiratory infection, unspecified: Secondary | ICD-10-CM

## 2023-10-10 NOTE — Patient Instructions (Addendum)
 A few things to remember from today's visit:  Cough, unspecified type  Lung examination does not sound like pneumonia. If you change your mind we can do the chest X ray. If cough gets worse or fever develops you need to let me know. If short of breath or chest pain you need to seek immediate medical attention. Over the counter plain mucinex may help as well as Flonase  nasal spray for nasal congestion.  Do not use My Chart to request refills or for acute issues that need immediate attention. If you send a my chart message, it may take a few days to be addressed, specially if I am not in the office.  Please be sure medication list is accurate. If a new problem present, please set up appointment sooner than planned today.

## 2023-10-10 NOTE — Progress Notes (Signed)
 ACUTE VISIT Chief Complaint  Patient presents with   Cough    Started on 12/27, still having cough   Nasal Congestion   HPI: Jacob Fletcher is a 53 y.o. male with a PMHx significant for HTN, paralysis of both lower limbs, gout, generalized weakness, abnormal liver function tests, and vitamin D  deficiency, who is here today complaining of dry cough and congestion.   Patient has had dry cough and congestion since 12/27 when he was traveling in [place] with his daughter. He also endorses some difficulty getting a full breath but has not experience SOB or DOE. Cough This is a new problem. The current episode started 1 to 4 weeks ago. The problem has been gradually improving. The problem occurs every few hours. The cough is Non-productive. Associated symptoms include nasal congestion, postnasal drip and rhinorrhea. Pertinent negatives include no chest pain, chills, ear congestion, ear pain, eye redness, fever, headaches, heartburn, hemoptysis, myalgias, rash, sore throat, sweats, weight loss or wheezing. He has tried nothing for the symptoms. His past medical history is significant for asthma and environmental allergies.   He says the cough worsens at night and when he talks a lot, but says it has improved some. It is interfering with his sleep occasionally.  He mentions his daughter has had similar symptoms over the same time period and was dx'ed with walking pneumonia on 1/2.  He hasn't taken anything OTC for his symptoms.   Pertinent negatives include fever, chills, body aches, chest pain, wheezing, or unusual heartburn or acid reflux.   Asthma: He uses Albuterol  inh as needed, has not taken medication in a while.  Review of Systems  Constitutional:  Negative for chills, fever and weight loss.  HENT:  Positive for postnasal drip and rhinorrhea. Negative for ear pain and sore throat.   Eyes:  Negative for discharge and redness.  Respiratory:  Positive for cough. Negative for hemoptysis and  wheezing.   Cardiovascular:  Negative for chest pain.  Gastrointestinal:  Negative for abdominal pain, heartburn, nausea and vomiting.  Musculoskeletal:  Negative for myalgias.  Skin:  Negative for rash.  Allergic/Immunologic: Positive for environmental allergies.  Neurological:  Negative for syncope, weakness and headaches.  See other pertinent positives and negatives in HPI.  Current Outpatient Medications on File Prior to Visit  Medication Sig Dispense Refill   albuterol  (VENTOLIN  HFA) 108 (90 Base) MCG/ACT inhaler Inhale 2 puffs into the lungs every 6 (six) hours as needed for wheezing or shortness of breath. 8 g 2   lisinopril -hydrochlorothiazide  (ZESTORETIC ) 10-12.5 MG tablet Take 1 tablet by mouth daily. 90 tablet 1   meclizine  (ANTIVERT ) 25 MG tablet Take 0.5-1 tablets (12.5-25 mg total) by mouth 3 (three) times daily as needed for dizziness. 10 tablet 0   No current facility-administered medications on file prior to visit.   Past Medical History:  Diagnosis Date   Allergy    Asthma    Hypertension    Allergies  Allergen Reactions   Amoxicillin  Rash    Rash all over body, itching.    Social History   Socioeconomic History   Marital status: Married    Spouse name: Not on file   Number of children: 1   Years of education: Not on file   Highest education level: Not on file  Occupational History   Occupation: IT  Tobacco Use   Smoking status: Former    Types: Cigarettes   Smokeless tobacco: Never  Vaping Use   Vaping status: Never  Used  Substance and Sexual Activity   Alcohol use: Yes    Alcohol/week: 0.0 standard drinks of alcohol    Comment: every 3 weeks   Drug use: No   Sexual activity: Not on file  Other Topics Concern   Not on file  Social History Narrative   Not on file   Social Drivers of Health   Financial Resource Strain: Not on file  Food Insecurity: Not on file  Transportation Needs: Not on file  Physical Activity: Not on file  Stress: Not  on file  Social Connections: Not on file   Vitals:   10/10/23 1028  BP: 120/70  Pulse: 95  Resp: 16  Temp: 97.7 F (36.5 C)  SpO2: 97%   Body mass index is 34.37 kg/m.  Physical Exam Vitals and nursing note reviewed.  Constitutional:      General: He is not in acute distress.    Appearance: He is well-developed. He is not ill-appearing.  HENT:     Head: Normocephalic and atraumatic.     Right Ear: Tympanic membrane, ear canal and external ear normal.     Left Ear: Tympanic membrane, ear canal and external ear normal.     Nose: No congestion or rhinorrhea.     Right Sinus: No maxillary sinus tenderness or frontal sinus tenderness.     Left Sinus: No maxillary sinus tenderness or frontal sinus tenderness.     Mouth/Throat:     Mouth: Mucous membranes are moist.     Pharynx: Oropharynx is clear. Uvula midline.  Eyes:     Conjunctiva/sclera: Conjunctivae normal.  Cardiovascular:     Rate and Rhythm: Normal rate and regular rhythm.     Heart sounds: No murmur heard. Pulmonary:     Effort: Pulmonary effort is normal. No respiratory distress.     Breath sounds: Normal breath sounds. No stridor.  Lymphadenopathy:     Head:     Right side of head: No submandibular adenopathy.     Left side of head: No submandibular adenopathy.     Cervical: No cervical adenopathy.  Skin:    General: Skin is warm.     Findings: No erythema or rash.  Neurological:     Mental Status: He is alert and oriented to person, place, and time.  Psychiatric:        Mood and Affect: Mood and affect normal.   ASSESSMENT AND PLAN:  Mr. Bonadonna was seen today for dry cough and congestion.   URI, acute Symptoms suggests a viral etiology, reports that symptoms are gradually improving. I explained that symptomatic treatment is usually recommended in this case, so I do not think abx is needed at this time. Instructed to monitor for signs of complications, including new onset of fever among some, clearly  instructed about warning signs. I also explained that cough and nasal congestion can last a few days and sometimes weeks. Plain mucinex may help as well as Flonase  nasal spray for nasal congestion. F/U as needed.  Cough, unspecified type Lung auscultation today negative. Hx and examination do not suggest a serious illness like pneumonia. Offered a CXR but he prefers to hold on it. Because concerned about CAP, we could also try abx trial, discussed side effects, he agrees with holding on this. He does not want medication to help with cough. Monitor for new symptoms. Instructed about warning signs.  Return if symptoms worsen or fail to improve.  LILLETTE Leonce JINNY Bridget, acting as a neurosurgeon for  Tyler Cubit, MD., have documented all relevant documentation on the behalf of Cardarius Senat, MD, as directed by  Charmel Pronovost, MD while in the presence of Keller Mikels, MD.   I, Rage Beever, MD, have reviewed all documentation for this visit. The documentation on 10/10/23 for the exam, diagnosis, procedures, and orders are all accurate and complete.  Koen Antilla G. Rc Amison, MD  [facility]. [place] office.

## 2023-11-09 ENCOUNTER — Encounter: Payer: Self-pay | Admitting: Family Medicine

## 2023-11-09 ENCOUNTER — Ambulatory Visit: Payer: BC Managed Care – PPO | Admitting: Family Medicine

## 2023-11-09 VITALS — BP 120/84 | HR 74 | Ht 64.5 in | Wt 200.0 lb

## 2023-11-09 DIAGNOSIS — I10 Essential (primary) hypertension: Secondary | ICD-10-CM | POA: Diagnosis not present

## 2023-11-09 DIAGNOSIS — Z1211 Encounter for screening for malignant neoplasm of colon: Secondary | ICD-10-CM

## 2023-11-09 MED ORDER — LISINOPRIL-HYDROCHLOROTHIAZIDE 10-12.5 MG PO TABS: 1.0000 | ORAL_TABLET | Freq: Every day | ORAL | 1 refills | Status: DC

## 2023-11-09 NOTE — Progress Notes (Signed)
 Established Patient Office Visit  Subjective   Patient ID: Jacob Fletcher, male    DOB: [birth year]  Age: 53 y.o. MRN: [id number]  Chief Complaint  Patient presents with   Medical Management of Chronic Issues    Pt is here for a refill on lisinopril .    Medication Refill    Pt is here for his annual follow up for his HTN today, he reports he is cutting down on his regular soda consumption and is drinking black coffee now instead. He reports he is feeling well, no new symptoms or issues today.   HTN -- BP in office performed and is well controlled. He  reports no side effects to the medications, no chest pain, SOB, dizziness or headaches. He has a BP cuff at home and is checking BP regularly, reports they are in the normal range.        Current Outpatient Medications  Medication Instructions   albuterol  (VENTOLIN  HFA) 108 (90 Base) MCG/ACT inhaler 2 puffs, Inhalation, Every 6 hours PRN   lisinopril -hydrochlorothiazide  (ZESTORETIC ) 10-12.5 MG tablet 1 tablet, Oral, Daily   meclizine  (ANTIVERT ) 12.5-25 mg, Oral, 3 times daily PRN    Patient Active Problem List   Diagnosis Date Noted   Vitamin D  deficiency 07/15/2022   Gout involving toe of left foot 06/25/2018   Essential hypertension 06/25/2018   Abnormal liver function tests 06/06/2018   Fever 01/25/2015   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     Objective:     BP 120/84 (BP Location: Left Arm, Patient Position: Sitting, Cuff Size: Large)   Pulse 74   Ht 5' 4.5 (1.638 m)   Wt 200 lb (90.7 kg)   SpO2 97%   BMI 33.80 kg/m    Physical Exam Vitals reviewed.  Constitutional:      Appearance: Normal appearance. He is well-groomed. He is obese.  Eyes:     Conjunctiva/sclera: Conjunctivae normal.  Neck:     Thyroid: No thyromegaly.   Cardiovascular:     Rate and Rhythm: Normal rate and regular rhythm.     Heart sounds: S1 normal and S2 normal. No murmur heard. Pulmonary:     Effort: Pulmonary effort is normal.     Breath sounds: Normal breath sounds and air entry. No rales.  Abdominal:     General: Abdomen is flat. Bowel sounds are normal.  Musculoskeletal:     Right lower leg: No edema.     Left lower leg: No edema.  Neurological:     General: No focal deficit present.     Mental Status: He is alert and oriented to person, place, and time.     Gait: Gait is intact.  Psychiatric:        Mood and Affect: Mood and affect normal.      No results found for any visits on 11/09/23.    The 10-year ASCVD risk score (Arnett DK, et al., 2019) is: 4.1%    Assessment & Plan:  Essential hypertension Assessment & Plan: Current hypertension medications:       Sig   lisinopril -hydrochlorothiazide  (ZESTORETIC ) 10-12.5 MG tablet Take 1 tablet by mouth daily.      BP well controlled on the above medication, will continue as prescribed, refills sent. Pt is due for his annual labs today, will  place orders for CMP and lipid panel for CVD risk stratification.  Orders: -     Lisinopril -hydroCHLOROthiazide ; Take 1 tablet by mouth daily.  Dispense: 90 tablet; Refill: 1 -     Comprehensive metabolic panel; Future -     Lipid panel; Future  Colon cancer screening -     Ambulatory referral to Gastroenterology     Return in about 1 year (around 11/08/2024) for annual physical exam.    Heron CHRISTELLA Sharper, MD

## 2023-11-15 NOTE — Assessment & Plan Note (Signed)
Current hypertension medications:      Sig   lisinopril-hydrochlorothiazide (ZESTORETIC) 10-12.5 MG tablet Take 1 tablet by mouth daily.     BP well controlled on the above medication, will continue as prescribed, refills sent. Pt is due for his annual labs today, will place orders for CMP and lipid panel for CVD risk stratification.

## 2023-11-16 ENCOUNTER — Other Ambulatory Visit (INDEPENDENT_AMBULATORY_CARE_PROVIDER_SITE_OTHER): Payer: BC Managed Care – PPO

## 2023-11-16 DIAGNOSIS — I10 Essential (primary) hypertension: Secondary | ICD-10-CM | POA: Diagnosis not present

## 2023-11-16 LAB — LIPID PANEL
Cholesterol: 169 mg/dL (ref 0–200)
HDL: 38.3 mg/dL — ABNORMAL LOW (ref >39.00)
LDL Cholesterol: 99 mg/dL (ref 0–99)
NonHDL: 130.99
Total CHOL/HDL Ratio: 4
Triglycerides: 158 mg/dL — ABNORMAL HIGH (ref 0.0–149.0)
VLDL: 31.6 mg/dL (ref 0.0–40.0)

## 2023-11-16 LAB — COMPREHENSIVE METABOLIC PANEL
ALT: 87 U/L — ABNORMAL HIGH (ref 0–53)
AST: 39 U/L — ABNORMAL HIGH (ref 0–37)
Albumin: 4.5 g/dL (ref 3.5–5.2)
Alkaline Phosphatase: 63 U/L (ref 39–117)
BUN: 11 mg/dL (ref 6–23)
CO2: 31 meq/L (ref 19–32)
Calcium: 9.4 mg/dL (ref 8.4–10.5)
Chloride: 102 meq/L (ref 96–112)
Creatinine, Ser: 1 mg/dL (ref 0.40–1.50)
GFR: 87.1 mL/min (ref >60.00)
Glucose, Bld: 111 mg/dL — ABNORMAL HIGH (ref 70–99)
Potassium: 4.1 meq/L (ref 3.5–5.1)
Sodium: 139 meq/L (ref 135–145)
Total Bilirubin: 0.6 mg/dL (ref 0.2–1.2)
Total Protein: 7.4 g/dL (ref 6.0–8.3)

## 2023-11-20 ENCOUNTER — Encounter: Payer: Self-pay | Admitting: Family Medicine

## 2023-12-08 ENCOUNTER — Encounter: Payer: Self-pay | Admitting: Internal Medicine

## 2024-01-03 ENCOUNTER — Ambulatory Visit (AMBULATORY_SURGERY_CENTER)

## 2024-01-03 VITALS — Ht 64.5 in | Wt 196.0 lb

## 2024-01-03 DIAGNOSIS — Z1211 Encounter for screening for malignant neoplasm of colon: Secondary | ICD-10-CM

## 2024-01-03 MED ORDER — NA SULFATE-K SULFATE-MG SULF 17.5-3.13-1.6 GM/177ML PO SOLN: 1.0000 | Freq: Once | ORAL | 0 refills | Status: AC

## 2024-01-03 NOTE — Progress Notes (Signed)
Pre visit completed via phone call; Patient verified name, DOB, and address; No egg or soy allergy known to patient;  No issues known to pt with past sedation with any surgeries or procedures; Patient denies ever being told they had issues or difficulty with intubation;  No FH of Malignant Hyperthermia; Pt is not on diet pills; Pt is not on home 02;  Pt is not on blood thinners;  Pt denies issues with constipation;  No A fib or A flutter; Have any cardiac testing pending--NO Insurance verified during PV appt--- BCBS Pt can ambulate without assistance;  Pt denies use of chewing tobacco Discussed diabetic/weight loss medication holds; Discussed NSAID holds; Checked BMI to be less than 50; Pt instructed to use Single[employer]com or GoodRx for a price reduction on prep  Patient's chart reviewed by Cathlyn Parsons CNRA prior to previsit and patient appropriate for the [facility].  Pre visit completed and red dot placed by patient's name on their procedure day (on provider's schedule).    Instructions sent to MyChart per patient request;

## 2024-01-15 ENCOUNTER — Encounter: Payer: Self-pay | Admitting: Internal Medicine

## 2024-01-17 ENCOUNTER — Ambulatory Visit (AMBULATORY_SURGERY_CENTER): Admitting: Internal Medicine

## 2024-01-17 ENCOUNTER — Encounter: Payer: Self-pay | Admitting: Internal Medicine

## 2024-01-17 VITALS — BP 128/82 | HR 71 | Temp 98.4°F | Resp 13 | Ht 64.5 in | Wt 196.0 lb

## 2024-01-17 DIAGNOSIS — Z1211 Encounter for screening for malignant neoplasm of colon: Secondary | ICD-10-CM | POA: Diagnosis not present

## 2024-01-17 DIAGNOSIS — K5730 Diverticulosis of large intestine without perforation or abscess without bleeding: Secondary | ICD-10-CM | POA: Diagnosis not present

## 2024-01-17 MED ORDER — SODIUM CHLORIDE 0.9 % IV SOLN
500.0000 mL | INTRAVENOUS | Status: DC
Start: 2024-01-17 — End: 2024-01-17

## 2024-01-17 NOTE — Progress Notes (Signed)
 Pt's states no medical or surgical changes since previsit or office visit.

## 2024-01-17 NOTE — Patient Instructions (Signed)

## 2024-01-17 NOTE — Progress Notes (Signed)
 HISTORY OF PRESENT ILLNESS:  Jacob Fletcher is a 53 y.o. male sent today for routine screening colonoscopy.  No complaints  REVIEW OF SYSTEMS:  All non-GI ROS negative except for  Past Medical History:  Diagnosis Date   Allergy    Asthma    Gout    Hypertension    Vertigo     Past Surgical History:  Procedure Laterality Date   CHOLECYSTECTOMY     CLOSED REDUCTION HAND FRACTURE Left 1985   boxers fracture   ESOPHAGEAL DILATION     VASECTOMY     VASECTOMY REVERSAL      Social History [name]  reports that he has quit smoking. His smoking use included cigarettes. He has never used smokeless tobacco. He reports current alcohol use. He reports that he does not use drugs.  family history includes Asthma in his paternal grandmother; Diabetes in his mother; Hypertension in his father and mother; Prostate cancer in his maternal uncle; Thyroid cancer (age of onset: 65) in his sister; Thyroid cancer (age of onset: 85) in his mother; Thyroid disease in his mother and sister.  Allergies  Allergen Reactions   Amoxicillin Rash    Rash all over body, itching.       PHYSICAL EXAMINATION: Vital signs: BP 138/89   Pulse 80   Temp 98.4 F (36.9 C)   Ht 5' 4.5" (1.638 m)   Wt 196 lb (88.9 kg)   SpO2 96%   BMI 33.12 kg/m  General: Well-developed, well-nourished, no acute distress HEENT: Sclerae are anicteric, conjunctiva pink. Oral mucosa intact Lungs: Clear Heart: Regular Abdomen: soft, nontender, nondistended, no obvious ascites, no peritoneal signs, normal bowel sounds. No organomegaly. Extremities: No edema Psychiatric: alert and oriented x3. Cooperative     ASSESSMENT:  Colon cancer screening   PLAN: Screening colonoscopy

## 2024-01-17 NOTE — Op Note (Signed)
 [facility] Patient Name: Jacob Fletcher Procedure Date: 01/17/2024 9:43 AM MRN: [id number] Endoscopist: Wilhemina Bonito. Marina Goodell , MD, [id number] Age: 53 Referring MD:  Date of Birth: 1970/10/13 Gender: Male Account #: [id number] Procedure:                Colonoscopy Indications:              Screening for colorectal malignant neoplasm Medicines:                Monitored Anesthesia Care Procedure:                Pre-Anesthesia Assessment:                           - Prior to the procedure, a History and Physical                            was performed, and patient medications and                            allergies were reviewed. The patient's tolerance of                            previous anesthesia was also reviewed. The risks                            and benefits of the procedure and the sedation                            options and risks were discussed with the patient.                            All questions were answered, and informed consent                            was obtained. Prior Anticoagulants: The patient has                            taken no anticoagulant or antiplatelet agents. ASA                            Grade Assessment: II - A patient with mild systemic                            disease. After reviewing the risks and benefits,                            the patient was deemed in satisfactory condition to                            undergo the procedure.                           After obtaining informed consent, the colonoscope  was passed under direct vision. Throughout the                            procedure, the patient's blood pressure, pulse, and                            oxygen saturations were monitored continuously. The                            CF [id number] #[id number] was introduced through the anus                            and advanced to the the cecum, identified by                            appendiceal orifice  and ileocecal valve. The                            ileocecal valve, appendiceal orifice, and rectum                            were photographed. The quality of the bowel                            preparation was excellent. The colonoscopy was                            performed without difficulty. The patient tolerated                            the procedure well. The bowel preparation used was                            SUPREP via split dose instruction. Scope In: 9:50:33 AM Scope Out: 10:00:20 AM Scope Withdrawal Time: 0 hours 8 minutes 21 seconds  Total Procedure Duration: 0 hours 9 minutes 47 seconds  Findings:                 A few small-mouthed diverticula were found in the                            left colon.                           The exam was otherwise without abnormality on                            direct and retroflexion views. Complications:            No immediate complications. Estimated blood loss:                            None. Estimated Blood Loss:     Estimated blood loss: none. Impression:               - Diverticulosis in the  left colon.                           - The examination was otherwise normal on direct                            and retroflexion views.                           - No specimens collected. Recommendation:           - Repeat colonoscopy in 10 years for screening                            purposes.                           - Patient has a contact number available for                            emergencies. The signs and symptoms of potential                            delayed complications were discussed with the                            patient. Return to normal activities tomorrow.                            Written discharge instructions were provided to the                            patient.                           - Resume previous diet.                           - Continue present medications. Murel Arlington. Elvin Hammer,  MD 01/17/2024 10:06:07 AM This report has been signed electronically.

## 2024-01-17 NOTE — Progress Notes (Signed)
 Report to PACU, RN, vss, BBS= Clear.

## 2024-01-18 ENCOUNTER — Telehealth: Payer: Self-pay

## 2024-01-18 NOTE — Telephone Encounter (Signed)
 Attempted f/u call. No answer, left VM.

## 2024-05-03 ENCOUNTER — Other Ambulatory Visit: Payer: Self-pay | Admitting: Family Medicine

## 2024-05-03 DIAGNOSIS — I10 Essential (primary) hypertension: Secondary | ICD-10-CM

## 2024-05-16 ENCOUNTER — Ambulatory Visit: Admitting: Family Medicine

## 2024-05-16 ENCOUNTER — Encounter: Payer: Self-pay | Admitting: Family Medicine

## 2024-05-16 VITALS — BP 110/72 | HR 79 | Temp 97.7°F | Ht 64.5 in | Wt 202.7 lb

## 2024-05-16 DIAGNOSIS — R252 Cramp and spasm: Secondary | ICD-10-CM

## 2024-05-16 DIAGNOSIS — I10 Essential (primary) hypertension: Secondary | ICD-10-CM

## 2024-05-16 DIAGNOSIS — R531 Weakness: Secondary | ICD-10-CM | POA: Diagnosis not present

## 2024-05-16 DIAGNOSIS — E559 Vitamin D deficiency, unspecified: Secondary | ICD-10-CM

## 2024-05-16 DIAGNOSIS — E162 Hypoglycemia, unspecified: Secondary | ICD-10-CM | POA: Diagnosis not present

## 2024-05-16 DIAGNOSIS — R0789 Other chest pain: Secondary | ICD-10-CM

## 2024-05-16 LAB — COMPREHENSIVE METABOLIC PANEL WITH GFR
ALT: 92 U/L — ABNORMAL HIGH (ref 0–53)
AST: 39 U/L — ABNORMAL HIGH (ref 0–37)
Albumin: 4.5 g/dL (ref 3.5–5.2)
Alkaline Phosphatase: 68 U/L (ref 39–117)
BUN: 16 mg/dL (ref 6–23)
CO2: 31 meq/L (ref 19–32)
Calcium: 10.1 mg/dL (ref 8.4–10.5)
Chloride: 101 meq/L (ref 96–112)
Creatinine, Ser: 0.98 mg/dL (ref 0.40–1.50)
GFR: 88.93 mL/min (ref >60.00)
Glucose, Bld: 85 mg/dL (ref 70–99)
Potassium: 4.4 meq/L (ref 3.5–5.1)
Sodium: 140 meq/L (ref 135–145)
Total Bilirubin: 0.5 mg/dL (ref 0.2–1.2)
Total Protein: 7.4 g/dL (ref 6.0–8.3)

## 2024-05-16 LAB — CBC WITH DIFFERENTIAL/PLATELET
Basophils Absolute: 0 K/uL (ref 0.0–0.1)
Basophils Relative: 0.7 % (ref 0.0–3.0)
Eosinophils Absolute: 0.5 K/uL (ref 0.0–0.7)
Eosinophils Relative: 7.6 % — ABNORMAL HIGH (ref 0.0–5.0)
HCT: 49.5 % (ref 39.0–52.0)
Hemoglobin: 17.1 g/dL — ABNORMAL HIGH (ref 13.0–17.0)
Lymphocytes Relative: 34.4 % (ref 12.0–46.0)
Lymphs Abs: 2.2 K/uL (ref 0.7–4.0)
MCHC: 34.6 g/dL (ref 30.0–36.0)
MCV: 92 fl (ref 78.0–100.0)
Monocytes Absolute: 0.8 K/uL (ref 0.1–1.0)
Monocytes Relative: 11.9 % (ref 3.0–12.0)
Neutro Abs: 2.9 K/uL (ref 1.4–7.7)
Neutrophils Relative %: 45.4 % (ref 43.0–77.0)
Platelets: 238 K/uL (ref 150.0–400.0)
RBC: 5.38 Mil/uL (ref 4.22–5.81)
RDW: 13.2 % (ref 11.5–15.5)
WBC: 6.3 K/uL (ref 4.0–10.5)

## 2024-05-16 LAB — TSH: TSH: 0.86 u[IU]/mL (ref 0.35–5.50)

## 2024-05-16 LAB — CK: Total CK: 64 U/L (ref 17–232)

## 2024-05-16 LAB — VITAMIN D 25 HYDROXY (VIT D DEFICIENCY, FRACTURES): VITD: 57.49 ng/mL (ref 30.00–100.00)

## 2024-05-16 LAB — VITAMIN B12: Vitamin B-12: 298 pg/mL (ref 211–911)

## 2024-05-16 LAB — HEMOGLOBIN A1C: Hgb A1c MFr Bld: 5.6 % (ref 4.6–6.5)

## 2024-05-16 NOTE — Progress Notes (Unsigned)
 Established Patient Office Visit  Subjective   Patient ID: Jacob Fletcher, male    DOB: 1971-04-25  Age: 53 y.o. MRN: [id number]  Chief Complaint  Patient presents with   Fatigue    Patient complains of episodes of weakness, lack of appetite, checking glucose at home which has been low-ranges 130, numbness bilateral hands, cramping right calf and tightness noted for the past few months    Pt reports that for several months he has been having problems with episodes of decreased appetite, generalized weakness, states that it is happening mostly in between meals, states that during these episodes he doesn't know wether he is going to fall over, throw up or defecate on himself. States that once he is able to eat again the symptoms improve and it gets much better. States that he has been checking his blood sugars regularly, states that he thought the blood sugars dropping were the cause of his symptoms, however yesterday was the worst day and he did not have any blood sugar drops that day. States that he feels like he is having trouble breathing, like something is pushing on his chest, and he continues to have a chronic pain in the left calf. States his finger tips are always numb not related to position, it just always present. States his BM's are regular, no abdominal pain, no blood noted in the stool.   States that he does have coughing fits that are dry but reports these started after he was placed on lisinopril /HCT. States that they are random in nature, sometimes will go months without a coughing fit.     Current Outpatient Medications  Medication Instructions   albuterol  (VENTOLIN  HFA) 108 (90 Base) MCG/ACT inhaler 2 puffs, Inhalation, Every 6 hours PRN   cholecalciferol (VITAMIN D3) 2,000 Units, Daily   lisinopril -hydrochlorothiazide  (ZESTORETIC ) 10-12.5 MG tablet 1 tablet, Oral, Daily   loratadine -pseudoephedrine  (CLARITIN -D 12-HOUR) 5-120 MG tablet 1 tablet, Daily PRN   meclizine   (ANTIVERT ) 12.5-25 mg, Oral, 3 times daily PRN    Patient Active Problem List   Diagnosis Date Noted   Vitamin D  deficiency 07/15/2022   Gout involving toe of left foot 06/25/2018   Essential hypertension 06/25/2018   Abnormal liver function tests 06/06/2018   Fever 01/25/2015   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 Negative for chills, fever and weight loss.  Eyes:  Negative for blurred vision and double vision.  Respiratory:  Positive for cough (dry) and shortness of breath. Negative for sputum production.   Cardiovascular:  Positive for chest pain. Negative for palpitations and leg swelling.  Gastrointestinal:  Positive for nausea. Negative for abdominal pain, blood in stool, constipation, diarrhea, heartburn and vomiting.  Genitourinary:  Positive for frequency. Negative for dysuria.  Musculoskeletal:  Positive for myalgias (chronic left calf pain).  Skin:  Negative for rash.  Neurological:  Positive for dizziness and weakness (generalized). Negative for focal weakness.  Psychiatric/Behavioral:  Negative for hallucinations. The patient does not have insomnia.       Objective:     BP 110/72   Pulse 79   Temp 97.7 F (36.5 C) (Oral)   Ht 5' 4.5 (1.638 m)   Wt 202 lb 11.2 oz (91.9 kg)   SpO2 98%   BMI 34.26 kg/m     Physical Exam Vitals reviewed.  Constitutional:      Appearance: Normal appearance. He is obese.  HENT:     Mouth/Throat:     Mouth: Mucous membranes are moist.  Eyes:     Conjunctiva/sclera: Conjunctivae normal.  Neck:     Thyroid: No thyromegaly.  Cardiovascular:     Rate and Rhythm: Normal rate and regular rhythm.     Pulses: Normal pulses.     Heart sounds: Normal heart sounds. No murmur heard. Pulmonary:     Effort: Pulmonary effort is normal.      Breath sounds: Normal breath sounds. No wheezing.  Abdominal:     General: Bowel sounds are normal.     Palpations: Abdomen is soft.  Musculoskeletal:        General: No swelling or tenderness. Normal range of motion.     Right lower leg: No edema.     Left lower leg: No edema.  Skin:    Comments: Cold and clammy  Neurological:     General: No focal deficit present.     Mental Status: He is alert and oriented to person, place, and time. Mental status is at baseline.     Motor: No weakness.     Gait: Gait normal.  Psychiatric:        Mood and Affect: Mood normal.        Behavior: Behavior normal.      No results found for any visits on 05/16/24.     The 10-year ASCVD risk score (Arnett DK, et al., 2019) is: 3.9%    Assessment & Plan:  Hypoglycemia -     Hemoglobin A1c; Future  Vitamin D  deficiency -     VITAMIN D  25 Hydroxy (Vit-D Deficiency, Fractures); Future  Weakness generalized -     CBC with Differential/Platelet; Future -     Comprehensive metabolic panel with GFR; Future -     Vitamin B12; Future  Muscle cramp -     CK; Future  Chest tightness -     D-dimer, quantitative; Future  Essential hypertension -     TSH; Future   Patient has a myriad of symptoms, none are really correlating with a specific disease process, his physical exam findings show sweating but overall was fairly benign. Weight is stable despite the decrease appetite. The differential is broad and I discussed this with him. We will cast a wide net and order a full set of blood work to start the work up. I spent 30 minutes reviewing previous labs, colonoscopy report, US  abd complete from 2020, holter monitor from 2023, etc with the patient and we discussed possible etiologies including cardiac, abdominal and other causes. He has already had his GB removed, has fatty liver so will recheck liver function testing. Checking A1C to rule out diabetes. I gave him handouts on the low carb diet just in  case. Also with the chronic left calf pain it would be prudent to rule out any blood clots, even though there is no acute swelling and HOMANS sign is negative.   No follow-ups on file.    Heron CHRISTELLA Sharper, MD

## 2024-05-17 ENCOUNTER — Ambulatory Visit: Payer: Self-pay | Admitting: Family Medicine

## 2024-05-17 DIAGNOSIS — M79605 Pain in left leg: Secondary | ICD-10-CM

## 2024-05-17 LAB — D-DIMER, QUANTITATIVE: D-Dimer, Quant: 0.52 ug{FEU}/mL — ABNORMAL HIGH (ref <0.50)

## 2024-05-24 ENCOUNTER — Ambulatory Visit (HOSPITAL_COMMUNITY)
Admission: RE | Admit: 2024-05-24 | Discharge: 2024-05-24 | Disposition: A | Discharge status: Home / Self Care | Source: Ambulatory Visit | Attending: Family Medicine | Admitting: Family Medicine

## 2024-05-24 DIAGNOSIS — M79605 Pain in left leg: Secondary | ICD-10-CM | POA: Diagnosis not present

## 2024-05-24 DIAGNOSIS — M79604 Pain in right leg: Secondary | ICD-10-CM | POA: Diagnosis not present

## 2024-05-27 ENCOUNTER — Ambulatory Visit: Payer: Self-pay | Admitting: Family Medicine

## 2024-10-26 ENCOUNTER — Other Ambulatory Visit: Payer: Self-pay | Admitting: Family Medicine

## 2024-10-26 DIAGNOSIS — I10 Essential (primary) hypertension: Secondary | ICD-10-CM

## 2024-11-07 ENCOUNTER — Ambulatory Visit: Admitting: Family Medicine

## 2024-11-07 ENCOUNTER — Encounter: Payer: Self-pay | Admitting: Family Medicine

## 2024-11-07 VITALS — BP 122/72 | HR 76 | Temp 97.8°F | Ht 64.5 in | Wt 204.8 lb

## 2024-11-07 DIAGNOSIS — R202 Paresthesia of skin: Secondary | ICD-10-CM

## 2024-11-07 NOTE — Progress Notes (Signed)
 "  Established Patient Office Visit  Subjective   Patient ID: Jacob Fletcher, male    DOB: [birth year]  Age: 54 y.o. MRN: [id number]  Chief Complaint  Patient presents with   Tingling    Patient complains of numbness and tingling sensation bilateral hands x1 year, worse lately, wakes him up at night and having difficulty holding fork in the right hand, no known injury    HPI Discussed the use of AI scribe software for clinical note transcription with the patient, who gave verbal consent to proceed.  History of Present Illness   Jacob Fletcher is a 54 year old male who presents with worsening numbness and tingling in both hands.  He has had numbness and tingling in both hands for several years, with recent worsening. Symptoms are mainly triggered by activities involving the right hand but can also affect the left. On the right, numbness and tingling extend from the armpit to the elbow, wrist, and middle three fingers. Symptoms interfere with daily activities including eating and sleeping, and he often shakes his hand for relief.  Symptoms are worse in the morning and do not worsen at night. He recently developed new burning sensations in his hands, occurring two days ago and again yesterday. He has not had prior evaluation or testing for these symptoms.  He denies neck pain, significant neck injuries, vision changes, or slurred speech. He feels tired, which he attributes to home and work responsibilities, and does not feel depressed.       Current Outpatient Medications  Medication Instructions   albuterol  (VENTOLIN  HFA) 108 (90 Base) MCG/ACT inhaler 2 puffs, Inhalation, Every 6 hours PRN   lisinopril -hydrochlorothiazide  (ZESTORETIC ) 10-12.5 MG tablet 1 tablet, Oral, Daily   loratadine -pseudoephedrine  (CLARITIN -D 12-HOUR) 5-120 MG tablet 1 tablet, Daily PRN   Multiple Vitamin (MULTIVITAMIN ADULT PO) Daily    Patient Active Problem List   Diagnosis Date Noted   Vitamin D   deficiency 07/15/2022   Gout involving toe of left foot 06/25/2018   Essential hypertension 06/25/2018   Abnormal liver function tests 06/06/2018   Fever 01/25/2015   Paralysis of both lower limbs (HCC) 01/25/2015   Near syncope 01/24/2015   Weakness generalized 01/24/2015   Environmental allergies 09/07/2012     Review of Systems  All other systems reviewed and are negative.     Objective:     BP 122/72   Pulse 76   Temp 97.8 F (36.6 C) (Oral)   Ht 5' 4.5 (1.638 m)   Wt 204 lb 12.8 oz (92.9 kg)   SpO2 98%   BMI 34.61 kg/m    Physical Exam Vitals reviewed.  Constitutional:      Appearance: Normal appearance. He is obese.  Cardiovascular:     Pulses: Normal pulses.  Pulmonary:     Effort: Pulmonary effort is normal.  Musculoskeletal:     Right lower leg: No edema.     Left lower leg: No edema.  Skin:    Coloration: Skin is not pale.     Findings: No lesion.  Neurological:     Mental Status: He is alert and oriented to person, place, and time.      No results found for any visits on 11/07/24.    The 10-year ASCVD risk score (Arnett DK, et al., 2019) is: 4.7%    Assessment & Plan:  Paresthesia of hand, bilateral -     Ambulatory referral to Neurology   Assessment and Plan    Evaluation  of bilateral hand paresthesia Chronic bilateral hand paresthesia with recent exacerbation, primarily affecting the right hand. Symptoms include numbness, tingling, and burning sensations extending from the elbow to the wrist and three middle fingers, with occasional involvement of the armpit. Symptoms worsen with arm extension and are more pronounced in the morning. Differential diagnosis includes cervical radiculopathy, carpal tunnel syndrome, and ulnar nerve neuropathy. Vascular causes are unlikely due to normal pulses. Previous B12 levels were normal, and liver function tests were slightly elevated but stable. No recent trauma or significant neck pain reported. -  Referred to neurology for EMG to assess nerve conduction and identify potential nerve compression. - Will consider MRI of the neck if EMG indicates cervical radiculopathy. - Will discuss potential referral to hand surgeon if severe carpal tunnel syndrome is diagnosed. - No need to repeat B12 level unless new symptoms develop.        No follow-ups on file.    Heron CHRISTELLA Sharper, MD "

## 2024-11-08 ENCOUNTER — Other Ambulatory Visit: Payer: Self-pay

## 2024-11-08 ENCOUNTER — Encounter: Payer: Self-pay | Admitting: Neurology

## 2024-11-08 DIAGNOSIS — R202 Paresthesia of skin: Secondary | ICD-10-CM

## 2024-12-30 ENCOUNTER — Encounter: Payer: Self-pay | Admitting: Neurology

## 2025-02-14 ENCOUNTER — Ambulatory Visit: Payer: Self-pay | Admitting: Neurology
